# Patient Record
Sex: Female | Born: 1975 | Hispanic: Yes | Marital: Single | Smoking: Never smoker
Health system: Southern US, Community
[De-identification: ages and names within clinical notes are randomized; demographics above are authoritative.]

## PROBLEM LIST (undated history)

## (undated) ENCOUNTER — Inpatient Hospital Stay (HOSPITAL_COMMUNITY): Payer: Self-pay

## (undated) DIAGNOSIS — R519 Headache, unspecified: Secondary | ICD-10-CM

## (undated) DIAGNOSIS — R51 Headache: Secondary | ICD-10-CM

## (undated) DIAGNOSIS — Z789 Other specified health status: Secondary | ICD-10-CM

## (undated) HISTORY — PX: CHOLECYSTECTOMY: SHX55

## (undated) HISTORY — PX: APPENDECTOMY: SHX54

---

## 2005-06-23 ENCOUNTER — Inpatient Hospital Stay (HOSPITAL_COMMUNITY): Admission: AD | Admit: 2005-06-23 | Discharge: 2005-06-23 | Payer: Self-pay | Admitting: Family Medicine

## 2005-08-21 ENCOUNTER — Inpatient Hospital Stay (HOSPITAL_COMMUNITY): Admission: AD | Admit: 2005-08-21 | Discharge: 2005-08-21 | Payer: Self-pay | Admitting: Obstetrics and Gynecology

## 2005-09-17 ENCOUNTER — Inpatient Hospital Stay (HOSPITAL_COMMUNITY): Admission: AD | Admit: 2005-09-17 | Discharge: 2005-09-18 | Payer: Self-pay | Admitting: Obstetrics and Gynecology

## 2005-12-12 ENCOUNTER — Ambulatory Visit (HOSPITAL_COMMUNITY): Admission: RE | Admit: 2005-12-12 | Discharge: 2005-12-12 | Payer: Self-pay | Admitting: *Deleted

## 2006-04-03 ENCOUNTER — Ambulatory Visit: Payer: Self-pay | Admitting: Obstetrics and Gynecology

## 2006-04-06 ENCOUNTER — Ambulatory Visit: Payer: Self-pay | Admitting: Family Medicine

## 2006-04-06 ENCOUNTER — Inpatient Hospital Stay (HOSPITAL_COMMUNITY): Admission: AD | Admit: 2006-04-06 | Discharge: 2006-04-06 | Payer: Self-pay | Admitting: *Deleted

## 2006-04-06 ENCOUNTER — Inpatient Hospital Stay (HOSPITAL_COMMUNITY): Admission: AD | Admit: 2006-04-06 | Discharge: 2006-04-09 | Payer: Self-pay | Admitting: Gynecology

## 2006-05-14 ENCOUNTER — Inpatient Hospital Stay (HOSPITAL_COMMUNITY): Admission: EM | Admit: 2006-05-14 | Discharge: 2006-05-17 | Payer: Self-pay | Admitting: Emergency Medicine

## 2006-05-14 ENCOUNTER — Ambulatory Visit: Payer: Self-pay | Admitting: Family Medicine

## 2006-05-15 ENCOUNTER — Encounter (INDEPENDENT_AMBULATORY_CARE_PROVIDER_SITE_OTHER): Payer: Self-pay | Admitting: Specialist

## 2006-05-17 ENCOUNTER — Encounter: Admission: RE | Admit: 2006-05-17 | Discharge: 2006-06-16 | Payer: Self-pay | Admitting: Gynecology

## 2007-11-20 ENCOUNTER — Emergency Department (HOSPITAL_COMMUNITY): Admission: EM | Admit: 2007-11-20 | Discharge: 2007-11-20 | Payer: Self-pay | Admitting: Emergency Medicine

## 2008-07-23 ENCOUNTER — Inpatient Hospital Stay (HOSPITAL_COMMUNITY): Admission: AD | Admit: 2008-07-23 | Discharge: 2008-07-23 | Payer: Self-pay | Admitting: Family Medicine

## 2008-09-20 ENCOUNTER — Inpatient Hospital Stay (HOSPITAL_COMMUNITY): Admission: AD | Admit: 2008-09-20 | Discharge: 2008-09-20 | Payer: Self-pay | Admitting: Gynecology

## 2008-11-15 ENCOUNTER — Inpatient Hospital Stay (HOSPITAL_COMMUNITY): Admission: AD | Admit: 2008-11-15 | Discharge: 2008-11-15 | Payer: Self-pay | Admitting: Obstetrics & Gynecology

## 2008-11-27 ENCOUNTER — Ambulatory Visit (HOSPITAL_COMMUNITY): Admission: RE | Admit: 2008-11-27 | Discharge: 2008-11-27 | Payer: Self-pay | Admitting: Family Medicine

## 2009-02-06 ENCOUNTER — Ambulatory Visit: Payer: Self-pay | Admitting: Advanced Practice Midwife

## 2009-02-06 ENCOUNTER — Inpatient Hospital Stay (HOSPITAL_COMMUNITY): Admission: AD | Admit: 2009-02-06 | Discharge: 2009-02-08 | Payer: Self-pay | Admitting: Obstetrics and Gynecology

## 2010-05-30 ENCOUNTER — Emergency Department (HOSPITAL_COMMUNITY): Admission: EM | Admit: 2010-05-30 | Discharge: 2010-05-30 | Payer: Self-pay | Admitting: Emergency Medicine

## 2011-03-13 LAB — COMPREHENSIVE METABOLIC PANEL
AST: 26 U/L (ref 0–37)
Albumin: 4.2 g/dL (ref 3.5–5.2)
Alkaline Phosphatase: 76 U/L (ref 39–117)
Chloride: 103 mEq/L (ref 96–112)
GFR calc Af Amer: >60 mL/min (ref >60)
Potassium: 3.7 mEq/L (ref 3.5–5.1)
Total Bilirubin: 0.4 mg/dL (ref 0.3–1.2)
Total Protein: 7.7 g/dL (ref 6.0–8.3)

## 2011-03-13 LAB — POCT PREGNANCY, URINE: Preg Test, Ur: NEGATIVE

## 2011-03-13 LAB — URINE MICROSCOPIC-ADD ON

## 2011-03-13 LAB — CBC
Platelets: 226 10*3/uL (ref 150–400)
RDW: 12.8 % (ref 11.5–15.5)
WBC: 9 10*3/uL (ref 4.0–10.5)

## 2011-03-13 LAB — DIFFERENTIAL
Basophils Absolute: 0.1 10*3/uL (ref 0.0–0.1)
Eosinophils Relative: 7 % — ABNORMAL HIGH (ref 0–5)
Lymphocytes Relative: 29 % (ref 12–46)
Monocytes Absolute: 0.5 10*3/uL (ref 0.1–1.0)
Monocytes Relative: 6 % (ref 3–12)

## 2011-03-13 LAB — URINALYSIS, ROUTINE W REFLEX MICROSCOPIC
Bilirubin Urine: NEGATIVE
Glucose, UA: NEGATIVE mg/dL
Hgb urine dipstick: NEGATIVE
Specific Gravity, Urine: 1.021 (ref 1.005–1.030)
pH: 6.5 (ref 5.0–8.0)

## 2011-04-11 LAB — CBC
Hemoglobin: 11.7 g/dL — ABNORMAL LOW (ref 12.0–15.0)
MCHC: 33.8 g/dL (ref 30.0–36.0)
MCV: 84.1 fL (ref 78.0–100.0)
RBC: 4.11 MIL/uL (ref 3.87–5.11)
RDW: 16 % — ABNORMAL HIGH (ref 11.5–15.5)

## 2011-05-12 NOTE — H&P (Signed)
NAMEARENA, LINDAHL NO.:  [id number]   MEDICAL RECORD NO.:  [id number]          PATIENT TYPE:  EMS   LOCATION:  MAJO                         FACILITY:  [facility]   PHYSICIAN:  Velora Heckler, MD      DATE OF BIRTH:  01-04-1976   DATE OF ADMISSION:  05/14/2006  DATE OF DISCHARGE:                                HISTORY & PHYSICAL   CHIEF COMPLAINT:  Abdominal pain.   HISTORY OF PRESENT ILLNESS:  Ms. Dreese is a 35 year old female patient  recently postpartum about one month ago. She was awakened at 1 o'clock this  morning with complaints of severe epigastric and right upper quadrant  abdominal pain, continuous and intractable.  Patient came to the ER about 7  o'clock this morning.  Work-up revealed leukocytosis.  White count 13,600.  An ultrasound showed cholelithiasis with evidence of pericholecystic fluid  and gallbladder wall thickening consistent with acute cholecystitis.  No  ductal dilatation on ultrasound and LFTs have been normal.  History has been  obtained through the use of a translator.  Patient does not speak any  [place].  Her husband speaks limited [place].   ALLERGIES:  NO KNOWN DRUG ALLERGIES.   CURRENT MEDICATIONS:  None.   PAST MEDICAL HISTORY:  Recent childbirth.   PAST SURGICAL HISTORY:  Appendectomy as a child.   FAMILY HISTORY:  Noncontributory.   SOCIAL HISTORY:  Patient does not smoke or use alcohol products.  She is  currently on maternity leave.  She has a 7-month-old baby.  Husband  accompanies patient.   REVIEW OF SYSTEMS:  Patient is currently breast-feeding.  She has no similar  symptoms of epigastric pain, reflux or GI complaints in the past.   PHYSICAL EXAMINATION:  GENERAL APPEARANCE:  A female patient in acute  abdominal pain, very restless, has not received pain medications by the time  I evaluated her.  VITAL SIGNS:  Temperature 99.7, blood pressure 133/81, pulse 61 and regular,  respirations 20.  NECK:  Supple.   No adenopathy.  CHEST:  Bilateral lung sounds are clear to auscultation.  Respiratory effort  is nonlabored.  CARDIOVASCULAR:  S1 and S2, no murmurs, rubs, thrills or gallops.  ABDOMEN:  Soft.  Bowel sounds present.  She is exquisitely tender over the  epigastrium and right upper quadrant regions.  EXTREMITIES:  Symmetrical in appearance without clubbing, cyanosis, or  edema.  NEUROLOGIC:  Patient is alert and oriented x3, moving all extremities x4.   LABORATORY DATA:  Ultrasound shows gallstones with evidence of acute  cholecystitis. No ductal dilatation.  Lipase 24, amylase 52.  White count  13,200, hemoglobin 13, platelets 241,000.  Sodium 141, potassium 3.6, BUN  11, creatinine 0.6, glucose 131.  LFTs are normal.   IMPRESSION:  1.  Acute cholecystitis.  2.  Leukocytosis.  3.  Volume depletion.   PLAN:  1.  Admit patient to the general surgical floor, NPO, possibly OR this      afternoon pending availability of surgeon and OR suite.  More than      likely, for 10 o'clock in the morning.  2.  IV fluid hydration, D5 half normal saline with 20 potassium per liter at      150 an hour.  3.  Dilaudid for pain.  4.  Zofran for nausea.  5.  Begin Unasyn 3 g IV q.6h. for acute cholecystitis.  6.  Patient is breast-feeding.  Will attempt to obtain a breast pump and      supplies so patient can pump and discard her milk while hospitalized.      Allison L. Rennis Harding, N.P.      Velora Heckler, MD  Electronically Signed    ALE/MEDQ  D:  05/14/2006  T:  05/14/2006  Job:  [id number]

## 2011-05-12 NOTE — Discharge Summary (Signed)
Desiree Marquez, HEINKE NO.:  [id number]   MEDICAL RECORD NO.:  [id number]          PATIENT TYPE:  INP   LOCATION:  5708                         FACILITY:  [facility]   PHYSICIAN:  Velora Heckler, MD      DATE OF BIRTH:  22-Nov-1976   DATE OF ADMISSION:  05/14/2006  DATE OF DISCHARGE:  05/17/2006                                 DISCHARGE SUMMARY   DISCHARGING PHYSICIAN:  Velora Heckler, MD   CHIEF COMPLAINT/REASON FOR ADMISSION:  [name] is a 35 year old female  patient, 4 weeks postpartum, who awakened at 1:00 in the morning with severe  epigastric pain radiating into the right upper abdominal quadrant, which was  continuous and intractable.  By 7:00 that morning, she presented to the ER.  White count was 13,600.  An ultrasound revealed cholelithiasis with evidence  of pericholecystic fluid and gallbladder thickening consistent with acute  cholecystitis.  There was no ductal dilatation noted on ultrasound, and LFTs  were normal.  The patient does not speak English, and translator was used  during the history and physical exam, as well as explanation of surgical  procedure and consent application.   On exam, the patient was mildly febrile with a temperature of 99.7;  otherwise, vital signs were stable.  Her abdomen was soft with bowel sounds.  She was exquisitely tender over the epigastrium and right upper quadrant.  Because of the acuteness of the symptoms, she was taken to the OR on the  date of admission.   ADMITTING DIAGNOSES:  1.  Acute cholecystitis.  2.  Leukocytosis.  3.  Volume depletion.   HOSPITAL COURSE:  Because of OR scheduling and other emergent cases,  patient's cholecystectomy was deferred until May 15, 2006.  After patient  received pain medication and adequate IV fluid hydration, she was resting  more comfortably, and after Dr. Gerrit Friends discussed with the patient, they  agreed that they could wait until May 15, 2006, for surgical intervention.   On May 15, 2006, the patient was taken to the OR by Dr. Gerrit Friends, where she  underwent a laparoscopic cholecystectomy with intraoperative cholangiogram.  Her pre- and postoperative diagnoses were acute cholecystitis with  cholelithiasis.  The patient tolerated the procedure well without  complications and was sent to PACU to recovery and subsequently to the  General Medical Floor.  Postop day 1, patient was doing well.  Significant  pain.  Toradol was added to her underlying pain medication regimen to help  with inflammatory component of pain.  Important to note that on admission  patient is postpartum and apparently still breast-feeding her child.  Breast  pump was obtained for the patient so she could pump and discard her milk  while she was on pain medications.  She had received anesthesia, etc.   By postop day 2, patient was tolerating a diet and was deemed appropriate  for discharge home.  Her abdomen was soft.  Wound was unremarkable.  Greater  than 30 minutes was spent in preparation for discharge planning.  I  initially saw the patient at 11 a.m. and did  not complete discharge  paperwork and preparations until noon.  The discharge process was  complicated by determining appropriate medication to use in a breast-feeding  client.  I had to spend time with the pharmacist to determine appropriate  pain and antibiotic therapy to continue.  If patient was unable to  breastfeed using the medications necessary, we were also going to have to  obtain a breast pump to send home with the patient and make sure she  understood all the implications of not breast-feeding her child until the  medications have cleared from her system.  Fortunately, after discussing  with the pharmacist, it is okay to breastfeed using Percocet and Keflex, so  these medications were ordered.  Because she would be taking a narcotic pain  medication, we instructed her to be careful taking this medication while  caring for  her baby and recommended she have other people available to help  care for the baby, not only because of pain medications, but of activity  restrictions postoperatively.  She was also recommended to follow up with  the pediatrician regarding any concerns about medications and breast-  feeding.   FINAL DISCHARGE DIAGNOSES:  1.  Abdominal pain secondary to acute cholecystitis and cholelithiasis.  2.  Status post laparoscopic cholecystectomy with intraoperative      cholangiogram.  3.  Lactating female, currently breast-feeding infant.   DISCHARGE MEDICATIONS:  1.  Keflex 500 mg t.i.d. for the next 3 days.  2.  Percocet 5/325 one to two every 4-6 hours as needed for pain.   ACTIVITY:  Increase activity slowly.  May shower.  No driving while taking  the Percocet.  No lifting for 2 weeks.   WOUND CARE:  Allow Steri-Strips to fall off.   OTHER INSTRUCTIONS:  She has been instructed to pick up her breast pump at  [facility] at the Breast-feeding Department.  The charge is $30.  She  has also been instructed to watch to see if the baby gets too sleepy while  she is taking the Percocet.  If so, she may need to give bottled formula and  pump and throw away her breast milk.  She has also been instructed to bring  a translator with her to her appointment with Dr. Gerrit Friends.  She has been  instructed to call Dr. Gerrit Friends with any problems at [phone].  She has an  appointment scheduled for June 05, 2006, at 3 p.m.   Because of the complexity of her discharge instructions and the fact she  does not speak [place], these instructions were reviewed with the patient  through a translator.      Allison L. Rennis Harding, N.P.      Velora Heckler, MD  Electronically Signed    ALE/MEDQ  D:  06/15/2006  T:  06/15/2006  Job:  [id number]

## 2011-05-12 NOTE — Op Note (Signed)
Desiree Marquez, Desiree Marquez NO.:  [id number]   MEDICAL RECORD NO.:  [id number]          PATIENT TYPE:  INP   LOCATION:  5708                         FACILITY:  [facility]   PHYSICIAN:  Velora Heckler, MD      DATE OF BIRTH:  04/06/1976   DATE OF PROCEDURE:  05/15/2006  DATE OF DISCHARGE:                                 OPERATIVE REPORT   PREOPERATIVE DIAGNOSIS:  Acute cholecystitis, cholelithiasis.   POSTOPERATIVE DIAGNOSIS:  Acute cholecystitis, cholelithiasis.   PROCEDURE:  Laparoscopic cholecystectomy with intraoperative  cholangiography.   SURGEON:  Darnell Level, M.D., FACS   ASSISTANT:  Violeta Gelinas, M.D.   ANESTHESIA:  General.   ESTIMATED BLOOD LOSS:  Minimal.   PREPARATION:  Betadine.   COMPLICATIONS:  None.   INDICATIONS:  The patient is a 35 year old Hispanic female who presents to  the emergency department on May 21 with abdominal pain.  Workup revealed  gallbladder with gallstones and wall thickening consistent with  cholecystitis.  White blood cell count was elevated at 13,000.  Physical  examination showed right upper quadrant tenderness.  The patient was  admitted and treated with intravenous antibiotics.  She received medication  for pain control.  She is prepared and brought to the operating room for  cholecystectomy.   PROCEDURE DETAILS:  The procedure was done in OR #17 at the [facility].  The patient was brought to the operating room and placed  in supine position on the operating room table.  Following administration of  general anesthesia, the patient was prepped and draped in usual strict  aseptic fashion.  After ascertaining that an adequate level of anesthesia  been obtained, an infraumbilical incision was made with a #15 blade.  Dissection was carried down to the fascia.  Fascia was incised in the  midline.  The peritoneal cavity was entered cautiously.  Zero Vicryl  pursestring sutures were placed in the fascia.   A Hasson cannula was  introduced under direct vision and secured with a pursestring suture.  Abdomen was insufflated with carbon dioxide.  Laparoscope was introduced and  the abdomen explored.  There is a tense, distended, inflamed gallbladder in  the right upper quadrant with omental adhesions.  Operative ports were  placed along the right costal margin in the midline, midclavicular line and  anterior axillary line.  Fundus of the gallbladder was grasped.  It was  quite tense and distended and thick-walled.  Therefore, an aspirating trocar  was used to evacuate the gallbladder.  Gallbladder was then grasped and  retracted cephalad.  Omental adhesions were taken down with blunt  dissection.  Hemostasis obtained with electrocautery.  Dissection was  carried down to the neck of the gallbladder where the peritoneum was  incised.  Using gentle blunt dissection and judicious use of the  electrocautery,  the cystic duct was exposed.  It was dissected out along  its length.  A clip was placed at the neck of the gallbladder.  Cystic duct  was incised.  Clear yellow bile emanated from the cystic duct.  A Adriana Simas  cholangiography catheter was introduced through a stab wound in the right  upper quadrant and inserted into the cystic duct.  It was secured with a  Ligaclip.  Using C-arm fluoroscopy, real time cholangiography was performed.  There was a normal caliber common bile duct.  There was free flow distally  into the duodenum.  There was reflux of contrast proximally into the left  and right hepatic ductal systems.  Clip was withdrawn, and Cook catheter was  removed from the peritoneal cavity.  The gallbladder was then mobilized  further with gentle dissection.  Cystic artery was identified.  This was  doubly clipped and divided.  Posterior branch of the cystic artery was  doubly clipped and divided.  Remainder of the gallbladder was excised the  gallbladder bed using the hook electrocautery for  hemostasis.  Gallbladder  was completely excised and placed into an EndoCatch bag.  It was withdrawn  through the umbilical port.  Zero Vicryl pursestring was tied securely.  Right upper quadrant was copiously irrigated with warm saline which was  evacuated.  Good hemostasis was noted.  Pneumoperitoneum was released.  Ports were removed under direct vision.  All port sites show good  hemostasis.  Pneumoperitoneum was evacuated.  Port sites were anesthetized  with local anesthetic.  All wounds were closed with interrupted 4-0 Vicryl  subcuticular sutures.  Wounds were washed and dried, and Benzoin Steri-  Strips were applied.  Sterile dressings were applied.  The patient was  awakened from anesthesia and brought to the recovery room in stable  condition.  The patient tolerated the procedure well.      Velora Heckler, MD  Electronically Signed     TMG/MEDQ  D:  05/15/2006  T:  05/15/2006  Job:  [id number]

## 2011-09-22 LAB — CBC
Hemoglobin: 11.9 — ABNORMAL LOW
MCHC: 34.2
Platelets: 224
RDW: 12.8

## 2011-09-22 LAB — GC/CHLAMYDIA PROBE AMP, GENITAL: Chlamydia, DNA Probe: NEGATIVE

## 2011-09-22 LAB — ABO/RH: ABO/RH(D): O POS

## 2011-09-22 LAB — WET PREP, GENITAL
Clue Cells Wet Prep HPF POC: NONE SEEN
Trich, Wet Prep: NONE SEEN
Yeast Wet Prep HPF POC: NONE SEEN

## 2011-09-22 LAB — URINALYSIS, ROUTINE W REFLEX MICROSCOPIC
Bilirubin Urine: NEGATIVE
Glucose, UA: NEGATIVE
Hgb urine dipstick: NEGATIVE
Ketones, ur: 15 — AB
Protein, ur: NEGATIVE
pH: 6

## 2011-09-26 LAB — WET PREP, GENITAL
Clue Cells Wet Prep HPF POC: NONE SEEN
Trich, Wet Prep: NONE SEEN

## 2011-09-26 LAB — URINE MICROSCOPIC-ADD ON

## 2011-09-26 LAB — URINALYSIS, ROUTINE W REFLEX MICROSCOPIC
Bilirubin Urine: NEGATIVE
Glucose, UA: NEGATIVE
Hgb urine dipstick: NEGATIVE
Specific Gravity, Urine: 1.025
pH: 6

## 2011-10-03 LAB — URINALYSIS, ROUTINE W REFLEX MICROSCOPIC
Glucose, UA: NEGATIVE
Ketones, ur: NEGATIVE
Protein, ur: NEGATIVE

## 2011-10-03 LAB — DIFFERENTIAL
Basophils Absolute: 0.1
Basophils Relative: 1
Eosinophils Absolute: 0.5
Eosinophils Relative: 5
Lymphocytes Relative: 30

## 2011-10-03 LAB — COMPREHENSIVE METABOLIC PANEL
AST: 18
Alkaline Phosphatase: 67
CO2: 24
Chloride: 105
Creatinine, Ser: 0.56
GFR calc Af Amer: >60
GFR calc non Af Amer: >60
Potassium: 4
Total Bilirubin: 0.7

## 2011-10-03 LAB — CBC
HCT: 38.5
Hemoglobin: 13.1
MCHC: 33.9
MCV: 82.8
Platelets: 266
RBC: 4.65
RDW: 12.8
WBC: 8.5

## 2011-10-03 LAB — URINE MICROSCOPIC-ADD ON

## 2011-10-03 LAB — LIPASE, BLOOD: Lipase: 24

## 2011-12-31 ENCOUNTER — Encounter: Payer: Self-pay | Admitting: Emergency Medicine

## 2011-12-31 ENCOUNTER — Emergency Department (HOSPITAL_COMMUNITY)
Admission: EM | Admit: 2011-12-31 | Discharge: 2011-12-31 | Disposition: A | Discharge status: Home / Self Care | Payer: Self-pay | Attending: Emergency Medicine | Admitting: Emergency Medicine

## 2011-12-31 DIAGNOSIS — N949 Unspecified condition associated with female genital organs and menstrual cycle: Secondary | ICD-10-CM | POA: Insufficient documentation

## 2011-12-31 DIAGNOSIS — N390 Urinary tract infection, site not specified: Secondary | ICD-10-CM | POA: Insufficient documentation

## 2011-12-31 DIAGNOSIS — N72 Inflammatory disease of cervix uteri: Secondary | ICD-10-CM | POA: Insufficient documentation

## 2011-12-31 DIAGNOSIS — N898 Other specified noninflammatory disorders of vagina: Secondary | ICD-10-CM | POA: Insufficient documentation

## 2011-12-31 DIAGNOSIS — R109 Unspecified abdominal pain: Secondary | ICD-10-CM | POA: Insufficient documentation

## 2011-12-31 LAB — URINALYSIS, ROUTINE W REFLEX MICROSCOPIC
Bilirubin Urine: NEGATIVE
Ketones, ur: NEGATIVE mg/dL
Nitrite: POSITIVE — AB
Protein, ur: 100 mg/dL — AB
Urobilinogen, UA: 1 mg/dL (ref 0.0–1.0)

## 2011-12-31 LAB — POCT I-STAT, CHEM 8
Calcium, Ion: 1.11 mmol/L — ABNORMAL LOW (ref 1.12–1.32)
Glucose, Bld: 104 mg/dL — ABNORMAL HIGH (ref 70–99)
HCT: 40 % (ref 36.0–46.0)
TCO2: 24 mmol/L (ref 0–100)

## 2011-12-31 LAB — URINE MICROSCOPIC-ADD ON

## 2011-12-31 LAB — POCT PREGNANCY, URINE: Preg Test, Ur: NEGATIVE

## 2011-12-31 MED ORDER — PHENAZOPYRIDINE HCL 200 MG PO TABS: 200.0000 mg | ORAL_TABLET | Freq: Three times a day (TID) | ORAL | Status: AC

## 2011-12-31 MED ORDER — CIPROFLOXACIN HCL 500 MG PO TABS: 500.0000 mg | ORAL_TABLET | Freq: Two times a day (BID) | ORAL | Status: AC

## 2011-12-31 MED ORDER — KETOROLAC TROMETHAMINE 30 MG/ML IJ SOLN
60.0000 mg | Freq: Once | INTRAMUSCULAR | Status: AC
  Administered 2011-12-31: 60 mg via INTRAMUSCULAR
  Filled 2011-12-31: qty 2

## 2011-12-31 MED ORDER — METRONIDAZOLE 500 MG PO TABS: 500.0000 mg | ORAL_TABLET | Freq: Two times a day (BID) | ORAL | Status: AC

## 2011-12-31 MED ORDER — CIPROFLOXACIN HCL 500 MG PO TABS: 500.0000 mg | ORAL_TABLET | Freq: Two times a day (BID) | ORAL | Status: DC

## 2011-12-31 NOTE — ED Notes (Signed)
PT. REPORTS LOW ABDOMINAL / VAGINAL PAIN FOR 3 DAYS WITH FEVER AND CHILLS , DENIES NAUSEA ,VOMITTING OR DIARRHEA .

## 2011-12-31 NOTE — ED Provider Notes (Signed)
History     CSN: [id number]  Arrival date & time 12/31/11  0601   First MD Initiated Contact with Patient 12/31/11 [phone]      Chief Complaint  Patient presents with  . Abdominal Pain    (Consider location/radiation/quality/duration/timing/severity/associated sxs/prior treatment) HPI  Pt presents to the emergency department with complaints of low abdominal pain and vaginal pain for 3 days. She has been having 1 day of bleeding and two days of discharge. She denies N/V/D. Pt has been unable to have sex because of the pain. The pain is sharp and severe. Pt has a merina device in, which she has had for 3 years and has never caused problems.  History reviewed. No pertinent past medical history.  Past Surgical History  Procedure Date  . Appendectomy   . Cholecystectomy     No family history on file.  History  Substance Use Topics  . Smoking status: Never Smoker   . Smokeless tobacco: Not on file  . Alcohol Use: No    OB History    Grav Para Term Preterm Abortions TAB SAB Ect Mult Living                  Review of Systems  All other systems reviewed and are negative.    Allergies  Review of patient's allergies indicates no known allergies.  Home Medications  No current outpatient prescriptions on file.  BP 120/82  Pulse 85  Temp(Src) 98.5 F (36.9 C) (Oral)  Resp 18  SpO2 94%  LMP 11/30/2011  Physical Exam  Nursing note and vitals reviewed. Constitutional: She is oriented to person, place, and time. She appears well-developed and well-nourished.  HENT:  Head: Normocephalic and atraumatic.  Eyes: Conjunctivae are normal. Pupils are equal, round, and reactive to light.  Neck: Trachea normal, normal range of motion and full passive range of motion without pain. Neck supple.  Cardiovascular: Normal rate, regular rhythm and normal pulses.   Pulmonary/Chest: Effort normal and breath sounds normal. Chest wall is not dull to percussion. She exhibits no tenderness,  no crepitus, no edema, no deformity and no retraction.  Abdominal: Soft. Normal appearance and bowel sounds are normal. She exhibits no shifting dullness, no distension, no pulsatile liver, no fluid wave, no ascites and no mass. There is hepatosplenomegaly. There is tenderness in the suprapubic area. There is guarding. There is no rigidity, no rebound, no CVA tenderness, no tenderness at McBurney's point and negative Murphy's sign.  Genitourinary: Vagina normal and uterus normal. Pelvic exam was performed with patient supine. Cervix exhibits motion tenderness and discharge. Cervix exhibits no friability. Right adnexum displays tenderness. Right adnexum displays no mass and no fullness. Left adnexum displays no mass, no tenderness and no fullness.  Musculoskeletal: Normal range of motion.  Lymphadenopathy:       Head (right side): No submental, no submandibular, no tonsillar, no preauricular, no posterior auricular and no occipital adenopathy present.       Head (left side): No submental, no submandibular, no tonsillar, no preauricular, no posterior auricular and no occipital adenopathy present.    She has no cervical adenopathy.    She has no axillary adenopathy.  Neurological: She is oriented to person, place, and time. She has normal strength.  Skin: Skin is warm, dry and intact.  Psychiatric: Her speech is normal. Cognition and memory are normal.    ED Course  Procedures (including critical care time)  Labs Reviewed  URINALYSIS, ROUTINE W REFLEX MICROSCOPIC - Abnormal; Notable  for the following:    Color, Urine AMBER (*) BIOCHEMICALS MAY BE AFFECTED BY COLOR   APPearance TURBID (*)    Hgb urine dipstick LARGE (*)    Protein, ur 100 (*)    Nitrite POSITIVE (*)    Leukocytes, UA LARGE (*)    All other components within normal limits  WET PREP, GENITAL - Abnormal; Notable for the following:    Clue Cells, Wet Prep RARE (*)    WBC, Wet Prep HPF POC MODERATE (*)    All other components  within normal limits  URINE MICROSCOPIC-ADD ON - Abnormal; Notable for the following:    Bacteria, UA MANY (*)    All other components within normal limits  POCT PREGNANCY, URINE  POCT PREGNANCY, URINE  GC/CHLAMYDIA PROBE AMP, GENITAL   No results found.   1. UTI (lower urinary tract infection)   2. Cervicitis       MDM    Pt has a UTI and cervicitis, will treat with antibiotics and pain medication and have patient do close follow-up with an OB to ensure resolution of infections. If patient can not see OB, will have her return to the ED for check-up.      Dorthula Matas, PA 12/31/11 [phone]

## 2011-12-31 NOTE — ED Provider Notes (Signed)
Medical screening examination/treatment/procedure(s) were performed by non-physician practitioner and as supervising physician I was immediately available for consultation/collaboration. Devoria Albe, MD, Armando Gang   Ward Givens, MD 12/31/11 [phone]

## 2011-12-31 NOTE — ED Notes (Signed)
Pt reports stomach and vaginal pain for 3 days. No bleeding. Pelvic cart set up for further assessment by MD.

## 2012-01-01 LAB — GC/CHLAMYDIA PROBE AMP, GENITAL
Chlamydia, DNA Probe: NEGATIVE
GC Probe Amp, Genital: NEGATIVE

## 2013-03-27 ENCOUNTER — Encounter (HOSPITAL_COMMUNITY): Payer: Self-pay | Admitting: *Deleted

## 2013-03-27 DIAGNOSIS — R109 Unspecified abdominal pain: Secondary | ICD-10-CM | POA: Insufficient documentation

## 2013-03-27 DIAGNOSIS — N760 Acute vaginitis: Secondary | ICD-10-CM | POA: Insufficient documentation

## 2013-03-27 DIAGNOSIS — D259 Leiomyoma of uterus, unspecified: Secondary | ICD-10-CM | POA: Insufficient documentation

## 2013-03-27 DIAGNOSIS — Z3202 Encounter for pregnancy test, result negative: Secondary | ICD-10-CM | POA: Insufficient documentation

## 2013-03-27 DIAGNOSIS — Z9089 Acquired absence of other organs: Secondary | ICD-10-CM | POA: Insufficient documentation

## 2013-03-27 NOTE — ED Notes (Signed)
The pt is c/o lt flank and lower abd pain since this am.  No nor v.  lmp march 28th

## 2013-03-28 ENCOUNTER — Encounter (HOSPITAL_COMMUNITY): Payer: Self-pay | Admitting: Radiology

## 2013-03-28 ENCOUNTER — Emergency Department (HOSPITAL_COMMUNITY): Payer: Self-pay

## 2013-03-28 ENCOUNTER — Emergency Department (HOSPITAL_COMMUNITY)
Admission: EM | Admit: 2013-03-28 | Discharge: 2013-03-28 | Disposition: A | Discharge status: Home / Self Care | Payer: Self-pay | Attending: Emergency Medicine | Admitting: Emergency Medicine

## 2013-03-28 DIAGNOSIS — R109 Unspecified abdominal pain: Secondary | ICD-10-CM

## 2013-03-28 DIAGNOSIS — D219 Benign neoplasm of connective and other soft tissue, unspecified: Secondary | ICD-10-CM

## 2013-03-28 DIAGNOSIS — B9689 Other specified bacterial agents as the cause of diseases classified elsewhere: Secondary | ICD-10-CM

## 2013-03-28 LAB — CBC WITH DIFFERENTIAL/PLATELET
Lymphocytes Relative: 36 % (ref 12–46)
Lymphs Abs: 3.2 10*3/uL (ref 0.7–4.0)
Monocytes Relative: 5 % (ref 3–12)
Neutro Abs: 4.7 10*3/uL (ref 1.7–7.7)
Neutrophils Relative %: 53 % (ref 43–77)
WBC: 8.9 10*3/uL (ref 4.0–10.5)

## 2013-03-28 LAB — URINALYSIS, ROUTINE W REFLEX MICROSCOPIC
Bilirubin Urine: NEGATIVE
Hgb urine dipstick: NEGATIVE
Nitrite: NEGATIVE
Protein, ur: NEGATIVE mg/dL
Specific Gravity, Urine: 1.016 (ref 1.005–1.030)
Urobilinogen, UA: 0.2 mg/dL (ref 0.0–1.0)

## 2013-03-28 LAB — COMPREHENSIVE METABOLIC PANEL
Alkaline Phosphatase: 69 U/L (ref 39–117)
BUN: 12 mg/dL (ref 6–23)
Chloride: 100 mEq/L (ref 96–112)
GFR calc Af Amer: >90 mL/min (ref >90)
Glucose, Bld: 93 mg/dL (ref 70–99)
Potassium: 4.1 mEq/L (ref 3.5–5.1)
Total Bilirubin: 0.3 mg/dL (ref 0.3–1.2)

## 2013-03-28 LAB — WET PREP, GENITAL

## 2013-03-28 LAB — GC/CHLAMYDIA PROBE AMP: CT Probe RNA: NEGATIVE

## 2013-03-28 MED ORDER — HYDROMORPHONE HCL PF 1 MG/ML IJ SOLN
1.0000 mg | Freq: Once | INTRAMUSCULAR | Status: AC
  Administered 2013-03-28: 1 mg via INTRAVENOUS
  Filled 2013-03-28: qty 1

## 2013-03-28 MED ORDER — OXYCODONE-ACETAMINOPHEN 5-325 MG PO TABS: 1.0000 | ORAL_TABLET | ORAL | Status: DC | PRN

## 2013-03-28 MED ORDER — HYDROMORPHONE HCL PF 1 MG/ML IJ SOLN: 1.0000 mg | Freq: Once | INTRAMUSCULAR | Status: DC

## 2013-03-28 MED ORDER — METRONIDAZOLE 500 MG PO TABS: 500.0000 mg | ORAL_TABLET | Freq: Two times a day (BID) | ORAL | Status: DC

## 2013-03-28 MED ORDER — ONDANSETRON HCL 4 MG/2ML IJ SOLN
4.0000 mg | Freq: Once | INTRAMUSCULAR | Status: AC
  Administered 2013-03-28: 4 mg via INTRAVENOUS
  Filled 2013-03-28: qty 2

## 2013-03-28 NOTE — ED Provider Notes (Signed)
History     CSN: [id number]  Arrival date & time 03/27/13  2339   First MD Initiated Contact with Patient 03/28/13 0145      Chief Complaint  Patient presents with  . Abdominal Pain     Patient is a 37 y.o. female presenting with abdominal pain. The history is provided by the patient.  Abdominal Pain Pain location:  L flank Pain quality: sharp   Pain radiates to:  LLQ Pain severity:  Moderate Onset quality:  Gradual Duration:  1 day Timing:  Constant Progression:  Worsening Chronicity:  New Relieved by:  Nothing Worsened by:  Palpation Associated symptoms: no chest pain, no diarrhea, no fever, no shortness of breath and no vomiting     PMH - none  Past Surgical History  Procedure Laterality Date  . Appendectomy    . Cholecystectomy      No family history on file.  History  Substance Use Topics  . Smoking status: Never Smoker   . Smokeless tobacco: Not on file  . Alcohol Use: No    OB History   Grav Para Term Preterm Abortions TAB SAB Ect Mult Living                  Review of Systems  Constitutional: Negative for fever.  Respiratory: Negative for shortness of breath.   Cardiovascular: Negative for chest pain.  Gastrointestinal: Positive for abdominal pain. Negative for vomiting and diarrhea.  All other systems reviewed and are negative.    Allergies  Review of patient's allergies indicates no known allergies.  Home Medications  No current outpatient prescriptions on file.  BP 110/65  Pulse 73  Temp(Src) 98.1 F (36.7 C) (Oral)  Resp 16  SpO2 99%  LMP 03/21/2013  Physical Exam CONSTITUTIONAL: Well developed/well nourished, she appears uncomfortable HEAD: Normocephalic/atraumatic EYES: EOMI/PERRL ENMT: Mucous membranes moist NECK: supple no meningeal signs SPINE:entire spine nontender CV: S1/S2 noted, no murmurs/rubs/gallops noted LUNGS: Lungs are clear to auscultation bilaterally, no apparent distress ABDOMEN: soft, nontender, no  rebound or guarding WU:JWJX cva tenderness NEURO: Pt is awake/alert, moves all extremitiesx4 EXTREMITIES: pulses normal, full ROM SKIN: warm, color normal PSYCH: mildly anxious  ED Course  Procedures (including critical care time)  Labs Reviewed  CBC WITH DIFFERENTIAL - Abnormal; Notable for the following:    Eosinophils Relative 6 (*)    All other components within normal limits  COMPREHENSIVE METABOLIC PANEL - Abnormal; Notable for the following:    Sodium 134 (*)    All other components within normal limits  URINALYSIS, ROUTINE W REFLEX MICROSCOPIC  PREGNANCY, URINE   Ct Abdomen Pelvis Wo Contrast  03/28/2013  *RADIOLOGY REPORT*  Clinical Data: Left flank pain and left lower quadrant pain.  CT ABDOMEN AND PELVIS WITHOUT CONTRAST  Technique:  Multidetector CT imaging of the abdomen and pelvis was performed following the standard protocol without intravenous contrast.  Comparison: None.  Findings: The lung bases are clear.  The kidneys appear symmetrical in size and shape.  No pyelocaliectasis or ureterectasis.  No renal, ureteral, or bladder stones.  No bladder wall thickening.  Surgical absence of the gallbladder.  The unenhanced appearance of the liver, spleen, pancreas, adrenal glands, abdominal aorta, inferior vena cava, and retroperitoneal lymph nodes is unremarkable.  The stomach and small bowel are decompressed.  Stool filled colon without wall thickening.  The no free air or free fluid in the abdomen.  Pelvis:  Uterus and adnexal structures are not enlarged.  Surgical clips likely  representing tubal ligation.  No evidence of diverticulitis.  The appendix is not identified.  No free or loculated pelvic fluid collections.  No significant pelvic lymphadenopathy.  Normal alignment of the lumbar vertebrae.  IMPRESSION: No renal or ureteral stone or obstruction.   Original Report Authenticated By: Burman Nieves, M.D.    4:05 AM Ct imaging/labs unremarkable.  Pt still in significant  pain I utilized interpreter, and she reports onset of pain in left flank and "in the ovary" She denies vag bleeding/discharge She has not this previously Will need to perform pelvic exam with nurse and likely need ultrasound to r/o ovarian torsion Pt agreeable 4:16 AM Pelvic exam reveals left adnexal tenderness but no mass.  No cmt.  No vag bleeding or significant discharge.  Chaperone present US imaging pending at this time   5:32 AM No signs of torsion on [place] Pt stable for d/c Referred to gynecology  MDM  Nursing notes including past medical history and social history reviewed and considered in documentation Labs/vital reviewed and considered         Joya Gaskins, MD 03/28/13 [phone]

## 2013-05-31 ENCOUNTER — Inpatient Hospital Stay (HOSPITAL_COMMUNITY)
Admission: AD | Admit: 2013-05-31 | Discharge: 2013-05-31 | Disposition: A | Discharge status: Home / Self Care | Payer: Self-pay | Source: Ambulatory Visit | Attending: Family Medicine | Admitting: Family Medicine

## 2013-05-31 ENCOUNTER — Encounter (HOSPITAL_COMMUNITY): Payer: Self-pay | Admitting: Family

## 2013-05-31 ENCOUNTER — Inpatient Hospital Stay (HOSPITAL_COMMUNITY): Payer: Self-pay

## 2013-05-31 DIAGNOSIS — O039 Complete or unspecified spontaneous abortion without complication: Secondary | ICD-10-CM

## 2013-05-31 HISTORY — DX: Other specified health status: Z78.9

## 2013-05-31 LAB — CBC
HCT: 35.5 % — ABNORMAL LOW (ref 36.0–46.0)
MCH: 28 pg (ref 26.0–34.0)
MCV: 80.9 fL (ref 78.0–100.0)
Platelets: 161 10*3/uL (ref 150–400)
RBC: 4.39 MIL/uL (ref 3.87–5.11)

## 2013-05-31 LAB — WET PREP, GENITAL
Clue Cells Wet Prep HPF POC: NONE SEEN
Yeast Wet Prep HPF POC: NONE SEEN

## 2013-05-31 MED ORDER — IBUPROFEN 800 MG PO TABS: 800.0000 mg | ORAL_TABLET | Freq: Three times a day (TID) | ORAL | Status: DC | PRN

## 2013-05-31 MED ORDER — OXYCODONE-ACETAMINOPHEN 5-325 MG PO TABS: 1.0000 | ORAL_TABLET | ORAL | Status: DC | PRN

## 2013-05-31 NOTE — MAU Note (Signed)
Patient presents to MAU with c/o vaginal bleeding since yesterday, progressively worse today. Reports abdominal cramping x 2 days.  Reports +HPT at health dept. LMP 03/18/13.

## 2013-05-31 NOTE — MAU Provider Note (Signed)
History     CSN: [id number]  Arrival date and time: 05/31/13 4098   None     Chief Complaint  Patient presents with  . Vaginal Bleeding   HPI 37 y.o. G3P2 at [redacted]w[redacted]d by LMP here with bleeding x 2 days, getting heavier, + cramping.   Past Medical History  Diagnosis Date  . Medical history non-contributory     Past Surgical History  Procedure Laterality Date  . Appendectomy    . Cholecystectomy    . Cholecystectomy    . Appendectomy      History reviewed. No pertinent family history.  History  Substance Use Topics  . Smoking status: Not on file  . Smokeless tobacco: Never Used  . Alcohol Use: No    Allergies: No Known Allergies  Prescriptions prior to admission  Medication Sig Dispense Refill  . metroNIDAZOLE (FLAGYL) 500 MG tablet Take 1 tablet (500 mg total) by mouth 2 (two) times daily. One po bid x 7 days  14 tablet  0  . oxyCODONE-acetaminophen (PERCOCET/ROXICET) 5-325 MG per tablet Take 1 tablet by mouth every 4 (four) hours as needed for pain.  15 tablet  0    Review of Systems  Constitutional: Negative.   Respiratory: Negative.   Cardiovascular: Negative.   Gastrointestinal: Positive for abdominal pain. Negative for nausea, vomiting, diarrhea and constipation.  Genitourinary: Negative for dysuria, urgency, frequency, hematuria and flank pain.       + bleeding   Musculoskeletal: Negative.   Neurological: Negative.   Psychiatric/Behavioral: Negative.    Physical Exam   Blood pressure 126/77, pulse 72, temperature 97.8 F (36.6 C), temperature source Oral, resp. rate 16, last menstrual period 03/21/2013.  Physical Exam  Constitutional: She is oriented to person, place, and time. She appears well-developed and well-nourished. No distress.  HENT:  Head: Normocephalic and atraumatic.  Cardiovascular: Normal rate and regular rhythm.   Respiratory: Effort normal. No respiratory distress.  GI: Soft. She exhibits no distension and no mass. There is no  tenderness. There is no rebound and no guarding.  Genitourinary: There is no rash or lesion on the right labia. There is no rash or lesion on the left labia. Uterus is not deviated, not enlarged, not fixed and not tender. Cervix exhibits no motion tenderness, no discharge and no friability. Right adnexum displays no mass, no tenderness and no fullness. Left adnexum displays no mass, no tenderness and no fullness. There is bleeding (small) around the vagina. No erythema or tenderness around the vagina. No vaginal discharge found.  Neurological: She is alert and oriented to person, place, and time.  Skin: Skin is warm and dry.  Psychiatric: She has a normal mood and affect.   Unable to doppler FHT MAU Course  Procedures Results for orders placed during the hospital encounter of 05/31/13 (from the past 72 hour(s))  POCT PREGNANCY, URINE     Status: Abnormal   Collection Time    05/31/13 10:02 AM      Result Value Range   Preg Test, Ur POSITIVE (*) NEGATIVE   Comment:            THE SENSITIVITY OF THIS     METHODOLOGY IS >24 mIU/mL  POCT PREGNANCY, URINE     Status: Abnormal   Collection Time    05/31/13 10:07 AM      Result Value Range   Preg Test, Ur POSITIVE (*) NEGATIVE   Comment:  THE SENSITIVITY OF THIS     METHODOLOGY IS >24 mIU/mL  WET PREP, GENITAL     Status: Abnormal   Collection Time    05/31/13 10:24 AM      Result Value Range   Yeast Wet Prep HPF POC NONE SEEN  NONE SEEN   Trich, Wet Prep NONE SEEN  NONE SEEN   Clue Cells Wet Prep HPF POC NONE SEEN  NONE SEEN   WBC, Wet Prep HPF POC FEW (*) NONE SEEN   Comment: MODERATE BACTERIA SEEN  CBC     Status: Abnormal   Collection Time    05/31/13 10:37 AM      Result Value Range   WBC 5.2  4.0 - 10.5 K/uL   RBC 4.39  3.87 - 5.11 MIL/uL   Hemoglobin 12.3  12.0 - 15.0 g/dL   HCT 16.1 (*) 09.6 - 04.5 %   MCV 80.9  78.0 - 100.0 fL   MCH 28.0  26.0 - 34.0 pg   MCHC 34.6  30.0 - 36.0 g/dL   RDW 40.9  81.1 - 91.4 %    Platelets 161  150 - 400 K/uL  HCG, QUANTITATIVE, PREGNANCY     Status: Abnormal   Collection Time    05/31/13 10:37 AM      Result Value Range   hCG, Beta Chain, Quant, S 7124 (*) <5 mIU/mL   Comment:              GEST. AGE      CONC.  (mIU/mL)       <=1 WEEK        5 - 50         2 WEEKS       50 - 500         3 WEEKS       100 - 10,000         4 WEEKS     1,000 - 30,000         5 WEEKS     3,500 - 115,000       6-8 WEEKS     12,000 - 270,000        12 WEEKS     15,000 - 220,000                FEMALE AND NON-PREGNANT FEMALE:         LESS THAN 5 mIU/mL    US Ob Comp Less 14 Wks  05/31/2013   *RADIOLOGY REPORT*  Clinical Data: Pregnant, bleeding.  Unable to detect fetal heart tones on Doppler. Gravida 3, para 2.  OBSTETRIC <14 WK [place] AND TRANSVAGINAL OB US  Technique:  Both transabdominal and transvaginal ultrasound examinations were performed for complete evaluation of the gestation as well as the maternal uterus, adnexal regions, and pelvic cul-de-sac.  Transvaginal technique was performed to assess early pregnancy.  Comparison:  03/28/2013  Intrauterine gestational sac:  Present Yolk sac: Not seen Embryo: Present Cardiac Activity: Not seen Heart Rate: Zero bpm  CRL: 13.1  mm  7 w  4 d Maternal uterus/adnexae: The ovaries have a normal appearance.  Small anterior fibroid is 2.2 x 1.2 x 1.8 cm.  Posterior fibroid is 1.0 x 0.5 x 1.8 cm.  IMPRESSION:  1.  Early intrauterine fetal demise. 2.  Small uterine fibroids identified.   Original Report Authenticated By: Norva Pavlov, M.D.   US Ob Transvaginal  05/31/2013   *RADIOLOGY REPORT*  Clinical Data: Pregnant, bleeding.  Unable to detect fetal heart tones on Doppler. Gravida 3, para 2.  OBSTETRIC <14 WK [place] AND TRANSVAGINAL OB US  Technique:  Both transabdominal and transvaginal ultrasound examinations were performed for complete evaluation of the gestation as well as the maternal uterus, adnexal regions, and pelvic cul-de-sac.  Transvaginal  technique was performed to assess early pregnancy.  Comparison:  03/28/2013  Intrauterine gestational sac:  Present Yolk sac: Not seen Embryo: Present Cardiac Activity: Not seen Heart Rate: Zero bpm  CRL: 13.1  mm  7 w  4 d Maternal uterus/adnexae: The ovaries have a normal appearance.  Small anterior fibroid is 2.2 x 1.2 x 1.8 cm.  Posterior fibroid is 1.0 x 0.5 x 1.8 cm.  IMPRESSION:  1.  Early intrauterine fetal demise. 2.  Small uterine fibroids identified.   Original Report Authenticated By: Norva Pavlov, M.D.    Assessment and Plan   1. SAB (spontaneous abortion)   With interpreter, discussed results of u/s with patient. Pt initially did not understand diagnosis of miscarriage, reiterated u/s findings and explained further. Pt was very upset and reluctant to accept results. Pt stated she felt the baby moving inside her belly and that she felt it was alive. Pt asked if she "could leave it there for 9 months". Again, explained u/s results. Discussed cytotec vs. Expectant mgmt. Pt opts for expectant mgmt at this time. Rev'd precautions. Will f/u in clinic in 2 weeks. Discussed the possibility of needing medical or surgical management if miscarriage does not complete spontaneously.     Medication List    TAKE these medications       ibuprofen 800 MG tablet  Commonly known as:  ADVIL,MOTRIN  Take 1 tablet (800 mg total) by mouth every 8 (eight) hours as needed for pain.     oxyCODONE-acetaminophen 5-325 MG per tablet  Commonly known as:  PERCOCET/ROXICET  Take 1 tablet by mouth every 4 (four) hours as needed for pain.     prenatal multivitamin Tabs  Take 1 tablet by mouth daily at 12 noon.            Follow-up Information   Follow up with [facility] In 2 weeks. (someone will call to schedule)    Contact information:   [address] [place] [address] [phone]        [facility] 05/31/2013, 10:11 AM

## 2013-06-01 ENCOUNTER — Inpatient Hospital Stay (HOSPITAL_COMMUNITY)
Admission: AD | Admit: 2013-06-01 | Discharge: 2013-06-01 | Disposition: A | Discharge status: Home / Self Care | Payer: Self-pay | Source: Ambulatory Visit | Attending: Obstetrics & Gynecology | Admitting: Obstetrics & Gynecology

## 2013-06-01 ENCOUNTER — Encounter (HOSPITAL_COMMUNITY): Payer: Self-pay | Admitting: *Deleted

## 2013-06-01 DIAGNOSIS — IMO0002 Reserved for concepts with insufficient information to code with codable children: Secondary | ICD-10-CM

## 2013-06-01 DIAGNOSIS — O021 Missed abortion: Secondary | ICD-10-CM | POA: Insufficient documentation

## 2013-06-01 LAB — GC/CHLAMYDIA PROBE AMP
CT Probe RNA: NEGATIVE
GC Probe RNA: NEGATIVE

## 2013-06-01 MED ORDER — MISOPROSTOL 200 MCG PO TABS
800.0000 ug | ORAL_TABLET | Freq: Once | ORAL | Status: AC
  Administered 2013-06-01: 800 ug via VAGINAL
  Filled 2013-06-01: qty 4

## 2013-06-01 MED ORDER — PROMETHAZINE HCL 12.5 MG PO TABS: 12.5000 mg | ORAL_TABLET | Freq: Four times a day (QID) | ORAL | Status: DC | PRN

## 2013-06-01 NOTE — MAU Provider Note (Signed)
History     CSN: [id number]  Arrival date and time: 06/01/13 0028   None     Chief Complaint  Patient presents with  . Vaginal Bleeding   HPI  Pt is a G3P2002 at 7 wk intrauterine fetal demise according to ultrasound completed on 05/31/13.  Pt seen earlier in MAU for vaginal bleeding with the following results:  CBC Status: Abnormal    Collection Time    05/31/13 10:37 AM   Result  Value  Range    WBC  5.2  4.0 - 10.5 K/uL    RBC  4.39  3.87 - 5.11 MIL/uL    Hemoglobin  12.3  12.0 - 15.0 g/dL    HCT  40.9 (*)  81.1 - 46.0 %    MCV  80.9  78.0 - 100.0 fL    MCH  28.0  26.0 - 34.0 pg    MCHC  34.6  30.0 - 36.0 g/dL    RDW  91.4  78.2 - 95.6 %    Platelets  161  150 - 400 K/uL   HCG, QUANTITATIVE, PREGNANCY Status: Abnormal    Collection Time    05/31/13 10:37 AM   Result  Value  Range    hCG, Beta Chain, Quant, S  7124 (*)  <5 mIU/mL    Comment:      GEST. AGE CONC. (mIU/mL)     <=1 WEEK 5 - 50     2 WEEKS 50 - 500     3 WEEKS 100 - 10,000     4 WEEKS 1,000 - 30,000     5 WEEKS 3,500 - 115,000     6-8 WEEKS 12,000 - 270,000     12 WEEKS 15,000 - 220,000         FEMALE AND NON-PREGNANT FEMALE:     LESS THAN 5 mIU/mL   US Ob Comp Less 14 Wks  05/31/2013 *RADIOLOGY REPORT* Clinical Data: Pregnant, bleeding. Unable to detect fetal heart tones on Doppler. Gravida 3, para 2. OBSTETRIC <14 WK [place] AND TRANSVAGINAL OB US Technique: Both transabdominal and transvaginal ultrasound examinations were performed for complete evaluation of the gestation as well as the maternal uterus, adnexal regions, and pelvic cul-de-sac. Transvaginal technique was performed to assess early pregnancy. Comparison: 03/28/2013 Intrauterine gestational sac: Present Yolk sac: Not seen Embryo: Present Cardiac Activity: Not seen Heart Rate: Zero bpm CRL: 13.1 mm 7 w 4 d Maternal uterus/adnexae: The ovaries have a normal appearance. Small anterior fibroid is 2.2 x 1.2 x 1.8 cm. Posterior fibroid is 1.0 x 0.5 x 1.8  cm. IMPRESSION: 1. Early intrauterine fetal demise. 2. Small uterine fibroids identified. Original Report Authenticated By: Norva Pavlov, M.D.      Past Medical History  Diagnosis Date  . Medical history non-contributory     Past Surgical History  Procedure Laterality Date  . Appendectomy    . Cholecystectomy    . Cholecystectomy    . Appendectomy      History reviewed. No pertinent family history.  History  Substance Use Topics  . Smoking status: Never Smoker   . Smokeless tobacco: Never Used  . Alcohol Use: No    Allergies: No Known Allergies  Prescriptions prior to admission  Medication Sig Dispense Refill  . ibuprofen (ADVIL,MOTRIN) 800 MG tablet Take 1 tablet (800 mg total) by mouth every 8 (eight) hours as needed for pain.  30 tablet  0  . oxyCODONE-acetaminophen (PERCOCET/ROXICET) 5-325 MG per tablet Take 1 tablet  by mouth every 4 (four) hours as needed for pain.  20 tablet  0  . Prenatal Vit-Fe Fumarate-FA (PRENATAL MULTIVITAMIN) TABS Take 1 tablet by mouth daily at 12 noon.       Discharged with expected management, however pt is here desiring cytotec placed in MAU.  Husband present for this visit.    Review of Systems  Gastrointestinal: Positive for abdominal pain (cramping).  Genitourinary:       Vaginal bleeding  All other systems reviewed and are negative.   Physical Exam   Blood pressure 129/81, pulse 69, temperature 98.4 F (36.9 C), temperature source Oral, resp. rate 18, last menstrual period 03/21/2013, SpO2 100.00%.  Physical Exam  Constitutional: She is oriented to person, place, and time. She appears well-developed and well-nourished. No distress.  HENT:  Head: Normocephalic.  Neck: Normal range of motion. Neck supple.  Cardiovascular: Normal rate, regular rhythm and normal heart sounds.   Respiratory: Effort normal and breath sounds normal. No respiratory distress.  GI: Soft. She exhibits no mass. There is no tenderness. There is no  rebound and no guarding.  Genitourinary: There is bleeding around the vagina.  Musculoskeletal: Normal range of motion. She exhibits no edema.  Neurological: She is alert and oriented to person, place, and time.  Skin: Skin is warm and dry.   Cytotec 800 mcg placed vaginally without difficulty.  MAU Course  Procedures  Early Intrauterine Pregnancy Failure Protocol X  Documented intrauterine pregnancy failure less than or equal to [redacted] weeks   gestation  X  No serious current illness  X  Baseline Hgb greater than or equal to 10g/dl  X  Patient has easily accessible transportation to the hospital  X  Clear preference  X  Practitioner/physician deems patient reliable  X  Counseling by practitioner or physician  X  Patient education by RN  X   Intravaginally by NP in MAU  X   Ibuprofen 600 mg 1 tablet by mouth every 6 hours as needed #30 - prescribed  X   Tylenol #3 mg by mouth every 4 to 6 hours as needed - prescribed  X   Phenergan 12.5 mg by mouth every 4 hours as needed for nausea - prescribed  Reviewed with pt cytotec procedure.  Pt verbalizes that she lives close to the hospital and has transportation readily available.  Pt and husband appear reliable and verbalizes understanding and agrees with plan of care   Assessment and Plan  7 wk fetal demise  Plan: Pt discharged home with bleeding precautions RX phenergan Pt still has RX for pain meds from earlier visit  [facility] 06/01/2013, 1:41 AM

## 2013-06-01 NOTE — Progress Notes (Signed)
Written and verbal d/c instructions given and understanding voiced, In-house interpreter helped with d/c instructions

## 2013-06-01 NOTE — MAU Note (Signed)
Was seen here this am and told baby's heart was not functioning properly. Pt was given options but did not have chance to talk with husband until tonight. He wanted her to come back in to hospital. Having some headaches and back pain. Bleeding about like it was this morning but maybe alittle more.

## 2013-06-01 NOTE — MAU Note (Signed)
Cyctotec placed vaginally by W. Muhammed CNM and pt remained supine for about 

## 2013-06-01 NOTE — MAU Note (Signed)
PT presents with FOB. Using in-house interpreter. Pt went home this am and was unable to immediately talk with FOB about IUFD. When was able to talk with him could not explain exactly her options regarding IUFD. FOB is worried about the pt and wants to know more about the plan of care.

## 2013-06-02 NOTE — MAU Provider Note (Signed)
Chart reviewed and agree with management and plan.  

## 2013-06-18 ENCOUNTER — Encounter: Payer: Self-pay | Admitting: Family Medicine

## 2013-12-29 ENCOUNTER — Inpatient Hospital Stay (HOSPITAL_COMMUNITY)
Admission: AD | Admit: 2013-12-29 | Discharge: 2013-12-29 | Disposition: A | Discharge status: Home / Self Care | Payer: Self-pay | Source: Ambulatory Visit | Attending: Obstetrics & Gynecology | Admitting: Obstetrics & Gynecology

## 2013-12-29 ENCOUNTER — Encounter (HOSPITAL_COMMUNITY): Payer: Self-pay

## 2013-12-29 ENCOUNTER — Inpatient Hospital Stay (HOSPITAL_COMMUNITY): Payer: Self-pay

## 2013-12-29 DIAGNOSIS — D259 Leiomyoma of uterus, unspecified: Secondary | ICD-10-CM

## 2013-12-29 DIAGNOSIS — O26899 Other specified pregnancy related conditions, unspecified trimester: Secondary | ICD-10-CM

## 2013-12-29 DIAGNOSIS — O209 Hemorrhage in early pregnancy, unspecified: Secondary | ICD-10-CM

## 2013-12-29 DIAGNOSIS — R109 Unspecified abdominal pain: Secondary | ICD-10-CM | POA: Insufficient documentation

## 2013-12-29 DIAGNOSIS — O200 Threatened abortion: Secondary | ICD-10-CM

## 2013-12-29 LAB — CBC
HEMATOCRIT: 37.7 % (ref 36.0–46.0)
HEMOGLOBIN: 13.2 g/dL (ref 12.0–15.0)
MCH: 28.1 pg (ref 26.0–34.0)
MCHC: 35 g/dL (ref 30.0–36.0)
MCV: 80.2 fL (ref 78.0–100.0)
Platelets: 228 10*3/uL (ref 150–400)
RBC: 4.7 MIL/uL (ref 3.87–5.11)
RDW: 12.6 % (ref 11.5–15.5)
WBC: 6.8 10*3/uL (ref 4.0–10.5)

## 2013-12-29 LAB — WET PREP, GENITAL
CLUE CELLS WET PREP: NONE SEEN
TRICH WET PREP: NONE SEEN
YEAST WET PREP: NONE SEEN

## 2013-12-29 LAB — URINALYSIS, ROUTINE W REFLEX MICROSCOPIC
BILIRUBIN URINE: NEGATIVE
GLUCOSE, UA: NEGATIVE mg/dL
KETONES UR: NEGATIVE mg/dL
Leukocytes, UA: NEGATIVE
NITRITE: NEGATIVE
PH: 6 (ref 5.0–8.0)
Protein, ur: NEGATIVE mg/dL
Specific Gravity, Urine: 1.025 (ref 1.005–1.030)
Urobilinogen, UA: 0.2 mg/dL (ref 0.0–1.0)

## 2013-12-29 LAB — URINE MICROSCOPIC-ADD ON

## 2013-12-29 LAB — POCT PREGNANCY, URINE: Preg Test, Ur: POSITIVE — AB

## 2013-12-29 LAB — HCG, QUANTITATIVE, PREGNANCY: hCG, Beta Chain, Quant, S: 2619 m[IU]/mL — ABNORMAL HIGH (ref <5)

## 2013-12-29 NOTE — MAU Note (Signed)
Pt presents with complaints of vaginal spotting and thinks she is pregnant. She says that she had a demise in June and wants to make sure everything is ok if she is pregnant. She also states that she has irregular periods and is unsure of her last normal cycle.

## 2013-12-29 NOTE — Discharge Instructions (Signed)
Abdominal Pain During Pregnancy °Abdominal discomfort is common in pregnancy. Most of the time, it does not cause harm. There are many causes of abdominal pain. Some causes are more serious than others. Some of the causes of abdominal pain in pregnancy are easily diagnosed. Occasionally, the diagnosis takes time to understand. Other times, the cause is not determined. Abdominal pain can be a sign that something is very wrong with the pregnancy, or the pain may have nothing to do with the pregnancy at all. For this reason, always tell your caregiver if you have any abdominal discomfort. °CAUSES °Common and harmless causes of abdominal pain include: °· Constipation. °· Excess gas and bloating. °· Round ligament pain. This is pain that is felt in the folds of the groin. °· The position the baby or placenta is in. °· Baby kicks. °· Braxton-Hicks contractions. These are mild contractions that do not cause cervical dilation. °Serious causes of abdominal pain include: °· Ectopic pregnancy. This happens when a fertilized egg implants outside of the uterus. °· Miscarriage. °· Preterm labor. This is when labor starts at less than 37 weeks of pregnancy. °· Placental abruption. This is when the placenta partially or completely separates from the uterus. °· Preeclampsia. This is often associated with high blood pressure and has been referred to as "toxemia in pregnancy." °· Uterine or amniotic fluid infections.  °Causes unrelated to pregnancy include: °· Urinary tract infection. °· Gallbladder stones or inflammation. °· Hepatitis or other liver illness. °· Intestinal problems, stomach flu, food poisoning, or ulcer. °· Appendicitis. °· Kidney (renal) stones. °· Kidney infection (pylonephritis). °HOME CARE INSTRUCTIONS  °For mild pain: °· Do not have sexual intercourse or put anything in your vagina until your symptoms go away completely. °· Get plenty of rest until your pain improves. If your pain does not improve in 1 hour, call  your caregiver. °· Drink clear fluids if you feel nauseous. Avoid solid food as long as you are uncomfortable or nauseous. °· Only take medicine as directed by your caregiver. °· Keep all follow-up appointments with your caregiver. °SEEK IMMEDIATE MEDICAL CARE IF: °· You are bleeding, leaking fluid, or passing tissue from the vagina. °· You have increasing pain or cramping. °· You have persistent vomiting. °· You have painful or bloody urination. °· You have a fever. °· You notice a decrease in your baby's movements. °· You have extreme weakness or feel faint. °· You have shortness of breath, with or without abdominal pain. °· You develop a severe headache with abdominal pain. °· You have abnormal vaginal discharge with abdominal pain. °· You have persistent diarrhea. °· You have abdominal pain that continues even after rest, or gets worse. °MAKE SURE YOU:  °· Understand these instructions. °· Will watch your condition. °· Will get help right away if you are not doing well or get worse. °Document Released: 12/11/2005 Document Revised: 03/04/2012 Document Reviewed: 07/10/2013 °[employer]® Patient Information ©2014 [employer]. ° °

## 2013-12-29 NOTE — MAU Provider Note (Signed)
History     CSN: [id number]  Arrival date and time: 12/29/13 1141   None     Chief Complaint  Patient presents with  . Vaginal Bleeding   HPI  [name] is a 38 y.o. female [phone] at [redacted]w[redacted]d who presents with vaginal bleeding/ spotting. Spanish interpretor at bedside.  Spotting started yesterday, and at the time patient was experiencing no pain. Today she continues to have spotting and began having all over abdominal cramping; "period like cramping". Bleeding is described as bright red; very small amount. Pt is very certain of last menstrual cycle; unable to doppler fetal heart tones today in MAU.   OB History   Grav Para Term Preterm Abortions TAB SAB Ect Mult Living   4 2   1  1   2       Past Medical History  Diagnosis Date  . Medical history non-contributory     Past Surgical History  Procedure Laterality Date  . Appendectomy    . Cholecystectomy    . Cholecystectomy    . Appendectomy      History reviewed. No pertinent family history.  History  Substance Use Topics  . Smoking status: Never Smoker   . Smokeless tobacco: Never Used  . Alcohol Use: No    Allergies: No Known Allergies  Prescriptions prior to admission  Medication Sig Dispense Refill  . Prenatal Vit-Fe Fumarate-FA (PRENATAL MULTIVITAMIN) TABS Take 1 tablet by mouth daily at 12 noon.       Results for orders placed during the hospital encounter of 12/29/13 (from the past 48 hour(s))  URINALYSIS, ROUTINE W REFLEX MICROSCOPIC     Status: Abnormal   Collection Time    12/29/13 11:55 AM      Result Value Range   Color, Urine YELLOW  YELLOW   APPearance CLEAR  CLEAR   Specific Gravity, Urine 1.025  1.005 - 1.030   pH 6.0  5.0 - 8.0   Glucose, UA NEGATIVE  NEGATIVE mg/dL   Hgb urine dipstick LARGE (*) NEGATIVE   Bilirubin Urine NEGATIVE  NEGATIVE   Ketones, ur NEGATIVE  NEGATIVE mg/dL   Protein, ur NEGATIVE  NEGATIVE mg/dL   Urobilinogen, UA 0.2  0.0 - 1.0 mg/dL   Nitrite  NEGATIVE  NEGATIVE   Leukocytes, UA NEGATIVE  NEGATIVE  URINE MICROSCOPIC-ADD ON     Status: Abnormal   Collection Time    12/29/13 11:55 AM      Result Value Range   Squamous Epithelial / LPF FEW (*) RARE   WBC, UA 0-2  <3 WBC/hpf   RBC / HPF 0-2  <3 RBC/hpf  POCT PREGNANCY, URINE     Status: Abnormal   Collection Time    12/29/13 12:00 PM      Result Value Range   Preg Test, Ur POSITIVE (*) NEGATIVE   Comment:            THE SENSITIVITY OF THIS     METHODOLOGY IS >24 mIU/mL  WET PREP, GENITAL     Status: Abnormal   Collection Time    12/29/13  1:00 PM      Result Value Range   Yeast Wet Prep HPF POC NONE SEEN  NONE SEEN   Trich, Wet Prep NONE SEEN  NONE SEEN   Clue Cells Wet Prep HPF POC NONE SEEN  NONE SEEN   WBC, Wet Prep HPF POC FEW (*) NONE SEEN   Comment: MANY BACTERIA SEEN  CBC  Status: None   Collection Time    12/29/13  1:16 PM      Result Value Range   WBC 6.8  4.0 - 10.5 K/uL   RBC 4.70  3.87 - 5.11 MIL/uL   Hemoglobin 13.2  12.0 - 15.0 g/dL   HCT 37.7  36.0 - 46.0 %   MCV 80.2  78.0 - 100.0 fL   MCH 28.1  26.0 - 34.0 pg   MCHC 35.0  30.0 - 36.0 g/dL   RDW 12.6  11.5 - 15.5 %   Platelets 228  150 - 400 K/uL  HCG, QUANTITATIVE, PREGNANCY     Status: Abnormal   Collection Time    12/29/13  1:16 PM      Result Value Range   hCG, Beta Chain, Quant, S 2619 (*) <5 mIU/mL   Comment:              GEST. AGE      CONC.  (mIU/mL)       <=1 WEEK        5 - 50         2 WEEKS       50 - 500         3 WEEKS       100 - 10,000         4 WEEKS     1,000 - 30,000         5 WEEKS     3,500 - 115,000       6-8 WEEKS     12,000 - 270,000        12 WEEKS     15,000 - 220,000                FEMALE AND NON-PREGNANT FEMALE:         LESS THAN 5 mIU/mL    US Ob Transvaginal  12/29/2013   CLINICAL DATA:  Vaginal bleeding. 12 week 5 day gestational age by LMP.  EXAM: OBSTETRIC <14 WK [place] AND TRANSVAGINAL OB US  TECHNIQUE: Both transabdominal and transvaginal ultrasound  examinations were performed for complete evaluation of the gestation as well as the maternal uterus, adnexal regions, and pelvic cul-de-sac. Transvaginal technique was performed to assess early pregnancy.  COMPARISON:  None.  FINDINGS: Intrauterine gestational sac: Visualized/normal in shape.  Yolk sac:  Not visualized  Embryo:  Not visualized  MSD:  7  mm   5 w   2  d  Maternal uterus/adnexae: Probable tiny less than 1 cm fibroid in the anterior lower uterine segment. No mass or free fluid identified. Both ovaries are normal in appearance.  IMPRESSION: Single 5 week intrauterine gestational sac, which is discordant with LMP. Recommend close followup of quantitative beta HCG levels, followup ultrasound in 14 days, to assess viability.   Electronically Signed   By: Earle Gell M.D.   On: 12/29/2013 14:31    Review of Systems  Gastrointestinal: Positive for abdominal pain. Negative for nausea, vomiting, diarrhea and constipation.  Genitourinary: Negative for dysuria, urgency, frequency and hematuria.       No vaginal discharge. + vaginal bleeding. No dysuria.     Physical Exam   Blood pressure 125/76, pulse 90, temperature 98.4 F (36.9 C), resp. rate 16, height 4\' 11"  (1.499 m), weight 79.924 kg (176 lb 3.2 oz), last menstrual period 10/01/2013, SpO2 100.00%, unknown if currently breastfeeding.  Physical Exam  Constitutional: She is oriented to person, place, and time. She appears  well-developed and well-nourished. No distress.  HENT:  Head: Normocephalic.  Eyes: Pupils are equal, round, and reactive to light.  Neck: Neck supple.  Respiratory: Effort normal.  GI: Soft. She exhibits no distension. There is no tenderness. There is no rebound and no guarding.  Genitourinary:  Speculum exam: Vagina - Small amount of dark red vaginal blood in canal, no odor Cervix - small amount of active bleeding  Bimanual exam: Cervix closed, No CMT  Uterus non tender, normal size; gravid  Adnexa non  tender, no masses bilaterally GC/Chlam, wet prep done Chaperone present for exam.   Neurological: She is alert and oriented to person, place, and time.  Skin: Skin is warm. She is not diaphoretic.    MAU Course  Procedures None  MDM Unable to doppler fetal heart tones with doppler O positive blood type  CBC Beta hcg US transvaginal   Assessment and Plan   A: 5 week intrauterine gestational sac, no yolk sac, inconsistent with LMP  Abdominal pain in pregnancy Bleeding in pregnancy; first trimester Threatened miscarriage  Probable uterine fibroid found on [place]   P: Discharge home in stable condition Return in 48 hours for repeat Beta Hcg  Bleeding precautions discussed  Ectopic precautions discussed Return to MAU if symptoms were to worsen Support given   Darrelyn Hillock Burnie Hank, NP  12/29/2013, 7:11 PM

## 2013-12-30 LAB — GC/CHLAMYDIA PROBE AMP
CT Probe RNA: NEGATIVE
GC Probe RNA: NEGATIVE

## 2013-12-31 ENCOUNTER — Encounter (HOSPITAL_COMMUNITY): Payer: Self-pay | Admitting: Obstetrics and Gynecology

## 2013-12-31 ENCOUNTER — Inpatient Hospital Stay (HOSPITAL_COMMUNITY)
Admission: AD | Admit: 2013-12-31 | Discharge: 2013-12-31 | Disposition: A | Discharge status: Home / Self Care | Payer: Self-pay | Source: Ambulatory Visit | Attending: Obstetrics and Gynecology | Admitting: Obstetrics and Gynecology

## 2013-12-31 DIAGNOSIS — O209 Hemorrhage in early pregnancy, unspecified: Secondary | ICD-10-CM | POA: Insufficient documentation

## 2013-12-31 LAB — HCG, QUANTITATIVE, PREGNANCY: hCG, Beta Chain, Quant, S: 2853 m[IU]/mL — ABNORMAL HIGH (ref <5)

## 2013-12-31 MED ORDER — OXYCODONE-ACETAMINOPHEN 5-325 MG PO TABS: 1.0000 | ORAL_TABLET | ORAL | Status: DC | PRN

## 2013-12-31 NOTE — MAU Provider Note (Signed)
Chief Complaint: Follow-up   None    SUBJECTIVE HPI: Desiree Marquez is a 38 y.o. [phone] with unsure LMP presented 12/29/13 with vaginal bleeding and cramping. Quant was 2615 with IUGS c/w [redacted]w[redacted]d, no YS on [place]. Since then she has had increased bleeding heavier than a period and thinks she passed tissue earlier today. Now bleeding like light period. Has menstrual-like cramping. Desired pregnancy.  O pos  Past Medical History  Diagnosis Date  . Medical history non-contributory    OB History  Gravida Para Term Preterm AB SAB TAB Ectopic Multiple Living  4 2   1 1    2     # Outcome Date GA Lbr Len/2nd Weight Sex Delivery Anes PTL Lv  4 CUR           3 SAB           2 PAR      SVD   Y  1 PAR      SVD   Y     Past Surgical History  Procedure Laterality Date  . Appendectomy    . Cholecystectomy    . Cholecystectomy    . Appendectomy     History   Social History  . Marital Status: Married    Spouse Name: N/A    Number of Children: N/A  . Years of Education: N/A   Occupational History  . Not on file.   Social History Main Topics  . Smoking status: Never Smoker   . Smokeless tobacco: Never Used  . Alcohol Use: No  . Drug Use: No  . Sexual Activity: Yes    Birth Control/ Protection: None   Other Topics Concern  . Not on file   Social History Narrative  . No narrative on file   No current facility-administered medications on file prior to encounter.   Current Outpatient Prescriptions on File Prior to Encounter  Medication Sig Dispense Refill  . Prenatal Vit-Fe Fumarate-FA (PRENATAL MULTIVITAMIN) TABS Take 1 tablet by mouth daily at 12 noon.       No Known Allergies  ROS: Pertinent items in HPI  OBJECTIVE Blood pressure 128/81, pulse 72, temperature 98.4 F (36.9 C), temperature source Oral, resp. rate 16, last menstrual period 10/01/2013, SpO2 100.00%. GENERAL: Well-developed, well-nourished female in no acute distress.  ABDOMEN: Soft, non-tender   LAB  RESULTS Results for orders placed during the hospital encounter of 12/31/13 (from the past 24 hour(s))  HCG, QUANTITATIVE, PREGNANCY     Status: Abnormal   Collection Time    12/31/13  1:41 PM      Result Value Range   hCG, Beta Chain, Quant, S 2853 (*) <5 mIU/mL    IMAGING US Ob Transvaginal  12/29/2013   CLINICAL DATA:  Vaginal bleeding. 12 week 5 day gestational age by LMP.  EXAM: OBSTETRIC <14 WK [place] AND TRANSVAGINAL OB US  TECHNIQUE: Both transabdominal and transvaginal ultrasound examinations were performed for complete evaluation of the gestation as well as the maternal uterus, adnexal regions, and pelvic cul-de-sac. Transvaginal technique was performed to assess early pregnancy.  COMPARISON:  None.  FINDINGS: Intrauterine gestational sac: Visualized/normal in shape.  Yolk sac:  Not visualized  Embryo:  Not visualized  MSD:  7  mm   5 w   2  d  Maternal uterus/adnexae: Probable tiny less than 1 cm fibroid in the anterior lower uterine segment. No mass or free fluid identified. Both ovaries are normal in appearance.  IMPRESSION: Single 5 week  intrauterine gestational sac, which is discordant with LMP. Recommend close followup of quantitative beta HCG levels, followup ultrasound in 14 days, to assess viability.   Electronically Signed   By: Earle Gell M.D.   On: 12/29/2013 14:31    MAU COURSE Counseled on high probability failed pregnancy/SAB. Wants expectant management  ASSESSMENT 1. Bleeding in early pregnancy   Probable SAB by hx and minimal change in [place] Discharge home AVS on SAB, bleeding , bleeding precautions Follow-up Information   Follow up with Nursepractioner Mau, NP In 1 week. (repeat blood test)    Contact information:   [phone]        Medication List         oxyCODONE-acetaminophen 5-325 MG per tablet  Commonly known as:  PERCOCET/ROXICET  Take 1 tablet by mouth every 4 (four) hours as needed.     prenatal multivitamin Tabs tablet  Take 1 tablet  by mouth daily at 12 noon.       Follow-up Information   Follow up with Nursepractioner Mau, NP In 1 week. (repeat blood test)    Contact information:   [phone]       Lorene Dy, CNM 12/31/2013  5:00 PM

## 2013-12-31 NOTE — MAU Note (Signed)
Patient to MAU for repeat BHCG. Patient states she is bleeding like a period and has bad pain off and on.

## 2013-12-31 NOTE — Progress Notes (Signed)
Desiree Marquez, CNM discussed results and D/C instrs. Discussed F/U in 1 week. Interpretor Cristie Hem) present for conversation.

## 2014-01-07 ENCOUNTER — Encounter (HOSPITAL_COMMUNITY): Payer: Self-pay | Admitting: General Practice

## 2014-01-07 ENCOUNTER — Inpatient Hospital Stay (HOSPITAL_COMMUNITY): Payer: Self-pay

## 2014-01-07 ENCOUNTER — Inpatient Hospital Stay (HOSPITAL_COMMUNITY)
Admission: AD | Admit: 2014-01-07 | Discharge: 2014-01-07 | Disposition: A | Discharge status: Home / Self Care | Payer: Self-pay | Source: Ambulatory Visit | Attending: Obstetrics and Gynecology | Admitting: Obstetrics and Gynecology

## 2014-01-07 DIAGNOSIS — O0289 Other abnormal products of conception: Secondary | ICD-10-CM | POA: Insufficient documentation

## 2014-01-07 DIAGNOSIS — O020 Blighted ovum and nonhydatidiform mole: Secondary | ICD-10-CM

## 2014-01-07 LAB — HCG, QUANTITATIVE, PREGNANCY: hCG, Beta Chain, Quant, S: 3316 m[IU]/mL — ABNORMAL HIGH (ref <5)

## 2014-01-07 NOTE — MAU Provider Note (Signed)
Chief Complaint: Vaginal Bleeding   First Provider Initiated Contact with Patient 01/07/14 1103     SUBJECTIVE HPI: Desiree Marquez is a 38 y.o. [phone] at [redacted]w[redacted]d by LMP who presents to maternity admissions reporting bright red vaginal bleeding x1 week. She reports the bleeding is like her period, requiring a pad change every 2-3 hours.  She denies abdominal pain, dizziness, or fever/chills.  She was seen in MAU last week and diagnosed with likely miscarriage.   Past Medical History  Diagnosis Date  . Medical history non-contributory    Past Surgical History  Procedure Laterality Date  . Appendectomy    . Cholecystectomy    . Cholecystectomy    . Appendectomy     History   Social History  . Marital Status: Married    Spouse Name: N/A    Number of Children: N/A  . Years of Education: N/A   Occupational History  . Not on file.   Social History Main Topics  . Smoking status: Never Smoker   . Smokeless tobacco: Never Used  . Alcohol Use: No  . Drug Use: No  . Sexual Activity: Yes    Birth Control/ Protection: None   Other Topics Concern  . Not on file   Social History Narrative  . No narrative on file   No current facility-administered medications on file prior to encounter.   Current Outpatient Prescriptions on File Prior to Encounter  Medication Sig Dispense Refill  . Prenatal Vit-Fe Fumarate-FA (PRENATAL MULTIVITAMIN) TABS Take 1 tablet by mouth daily at 12 noon.       No Known Allergies  ROS: Pertinent items in HPI  OBJECTIVE Blood pressure 117/73, pulse 72, temperature 99.3 F (37.4 C), temperature source Oral, resp. rate 18, height 4' 11.02" (1.499 m), weight 79.833 kg (176 lb), last menstrual period 10/01/2013. GENERAL: Well-developed, well-nourished female in no acute distress.  HEENT: Normocephalic HEART: normal rate RESP: normal effort ABDOMEN: Soft, non-tender EXTREMITIES: Nontender, no edema NEURO: Alert and oriented Pelvic exam: Cervix  pink, visually closed, without lesion, small amount bright red bleeding without clots, vaginal walls and external genitalia normal Bimanual exam: Cervix 0/long/high, firm, anterior, neg CMT, uterus nontender, nonenlarged, adnexa without tenderness, enlargement, or mass  LAB RESULTS Results for orders placed during the hospital encounter of 01/07/14 (from the past 24 hour(s))  HCG, QUANTITATIVE, PREGNANCY     Status: Abnormal   Collection Time    01/07/14 10:58 AM      Result Value Range   hCG, Beta Chain, Quant, S 3316 (*) <5 mIU/mL    IMAGING US Ob Transvaginal  01/07/2014   CLINICAL DATA:  Vaginal bleeding, quantitative beta HCG 3,316 today and measured 2,853 on 12/31/2013  EXAM: TRANSVAGINAL OB ULTRASOUND  TECHNIQUE: Transvaginal ultrasound was performed for complete evaluation of the gestation as well as the maternal uterus, adnexal regions, and pelvic cul-de-sac.  COMPARISON:  12/29/2013  FINDINGS: Intrauterine gestational sac: Small gestational sac, irregular shaped  Yolk sac:  Not identified  Embryo:  Not identified  Cardiac Activity: N/A  Heart Rate: N/A bpm  MSD: 6.7  mm   5 w   2  d  Maternal uterus/adnexae:  No definite subchorionic hemorrhage.  Two small uterine leiomyomata, 21 x 19 x 19 mm and 8 x 5 x 7 mm.  Left ovary normal size and morphology, 3.4 x 2.0 x 2.7 cm.  Right ovary normal size and morphology, 2.8 x 2.3 x 2.2 cm.  No adnexal masses or free pelvic  fluid.  IMPRESSION: Small irregular gestational sac is identified within the endometrial canal, demonstrate little increased in size since prior ultrasound 12/29/2013.  No yolk sac or fetal pole identified.  Findings are suspicious but not yet definitive for failed pregnancy. Recommend follow-up US in 7 days for definitive diagnosis. This recommendation follows SRU consensus guidelines: Diagnostic Criteria for Nonviable Pregnancy Early in the First Trimester. [place] Med 2013; 355:9741-63.   Electronically Signed   By: Lavonia Dana  M.D.   On: 01/07/2014 13:41   US Ob Transvaginal  12/29/2013   CLINICAL DATA:  Vaginal bleeding. 12 week 5 day gestational age by LMP.  EXAM: OBSTETRIC <14 WK [place] AND TRANSVAGINAL OB US  TECHNIQUE: Both transabdominal and transvaginal ultrasound examinations were performed for complete evaluation of the gestation as well as the maternal uterus, adnexal regions, and pelvic cul-de-sac. Transvaginal technique was performed to assess early pregnancy.  COMPARISON:  None.  FINDINGS: Intrauterine gestational sac: Visualized/normal in shape.  Yolk sac:  Not visualized  Embryo:  Not visualized  MSD:  7  mm   5 w   2  d  Maternal uterus/adnexae: Probable tiny less than 1 cm fibroid in the anterior lower uterine segment. No mass or free fluid identified. Both ovaries are normal in appearance.  IMPRESSION: Single 5 week intrauterine gestational sac, which is discordant with LMP. Recommend close followup of quantitative beta HCG levels, followup ultrasound in 14 days, to assess viability.   Electronically Signed   By: Earle Gell M.D.   On: 12/29/2013 14:31    ASSESSMENT 1. Blighted ovum     PLAN Consult Dr Elly Modena Offered pt cytotec.  Pt declined today.   Discharge home F/U in 1 week in clinic--appointment time given Return to MAU as needed-- bleeding precautions given    Medication List         prenatal multivitamin Tabs tablet  Take 1 tablet by mouth daily at 12 noon.           Follow-up Information   Follow up with [facility]. (Venga a la clnica a las 12:45 pm el mircoles 21 de enero para los laboratorios.  Come to clinic at 12:45 pm on Wednesday, January 21 for labs. )    Specialty:  Obstetrics and Gynecology   Contact information:   [place] [address] [place] Certified Nurse-Midwife 01/07/2014  2:52 PM

## 2014-01-07 NOTE — MAU Note (Signed)
Pt in for routine repeat quant but also states that she has been bleeding heavily for a week. changing a pad 4x/day and passing clots. Has no c/o pain.

## 2014-01-07 NOTE — Discharge Instructions (Signed)
Embarazo anembrinico  (Blighted Ovum) Un huevo sin embrin Ecologist) se produce cuando un huevo fertilizado (embrin) se adhiere a la pared uterina, pero el embrin no se desarrolla. El saco embrionario (placenta) sigue creciendo a pesar de que el embrin no crece crece ni se desarrolla.La hormona del embarazo se sigue produciendo poquer la placenta se ha formado. Esto dar como resultado una prueba de Media planner positiva a pesar de Best boy un embarazo anormal. Esto ocurre dentro del primer trimestre, a veces antes que una mujer sepa que est embarazada.  CAUSAS  Generalmente es el resultado de problemas cromosmicos. Una de las causas puede ser una divisin celular anormal o esperma u vulos de baja calidad.  SNTOMAS  Al principio, pueden experimentarse signos de embarazo, como por ejemplo:   Falta del periodo menstrual.  Fatiga.  Ganas de vomitar (nuseas).  Dolor en las [place].  Test de embarazo positivo. Luego, puede haber signos de aborto, como:   Clicos abdominales.  Sangrado o hemorragia vaginal.  Perodo menstrual ms abundante que lo normal. DIAGNSTICO  El diagnstico se realiza con una ecografia que [place] un tero o un saco gestacional vaco.  TRATAMIENTO  El mdico la ayudar a Teacher, adult education cul es el mejor tratamiento para usted. El tratamiento puede ser:   Dejar que el cuerpo elimine naturalmente el tejido del embarazo anembrinico.  Tomar medicamentos para Product/process development scientist.  Realizar un procedimiento llamado dilatacin y curetaje (D & C) para eliminar los tejidos de la placenta.  La D & C puede ser til si desea examinar el tejido para determinar la causa del aborto. Hable con su mdico acerca de los riesgos que implica este procedimiento.  INSTRUCCIONES PARA EL CUIDADO EN EL HOGAR   Haga un seguimiento con su mdico para asegurarse de que la hormona del embarazo vuelve a cero.  Espere por lo menos 1 a 3 ciclos menstruales regulares antes  de intentar quedar embarazada de nuevo, o segn lo recomendado por su mdico. SOLICITE ATENCIN MDICA DE INMEDIATO SI:   Siente un dolor abdominal cada vez ms intenso.  Tiene una hemorragia abundante o [place] 1 a 2 apsitos por hora durante ms de 2 horas.  Se siente confundida, se marea o pierde el conocimiento. Document Released: 08/23/2011 Document Revised: 03/04/2012 [facility] Patient Information 2014 [place], [place].

## 2014-01-08 NOTE — MAU Provider Note (Signed)
Attestation of Attending Supervision of Advanced Practitioner (CNM/NP): Evaluation and management procedures were performed by the Advanced Practitioner under my supervision and collaboration.  I have reviewed the Advanced Practitioner's note and chart, and I agree with the management and plan.  Ardelle Haliburton 01/08/2014 1:43 PM

## 2014-01-14 ENCOUNTER — Other Ambulatory Visit: Payer: Self-pay

## 2014-01-14 DIAGNOSIS — O020 Blighted ovum and nonhydatidiform mole: Secondary | ICD-10-CM

## 2014-01-15 LAB — HCG, QUANTITATIVE, PREGNANCY: hCG, Beta Chain, Quant, S: 1907.4 m[IU]/mL

## 2014-01-19 ENCOUNTER — Telehealth: Payer: Self-pay | Admitting: General Practice

## 2014-01-19 ENCOUNTER — Encounter: Payer: Self-pay | Admitting: General Practice

## 2014-01-19 NOTE — Telephone Encounter (Signed)
Called patient and a message stated this person is not accepting incoming calls at this time. Will send patient letter.

## 2014-01-19 NOTE — Telephone Encounter (Signed)
Message copied by Shelly Coss on Mon Jan 19, 2014 11:46 AM ------      Message from: Verita Schneiders A      Created: Thu Jan 15, 2014  5:14 PM       Repeat HCG draw in one week.  Please call to inform patient of results and recommendations.       ------

## 2014-01-21 ENCOUNTER — Other Ambulatory Visit: Payer: Self-pay

## 2014-01-21 DIAGNOSIS — O020 Blighted ovum and nonhydatidiform mole: Secondary | ICD-10-CM

## 2014-01-22 LAB — HCG, QUANTITATIVE, PREGNANCY: HCG, BETA CHAIN, QUANT, S: 792.3 m[IU]/mL

## 2014-01-27 ENCOUNTER — Encounter (HOSPITAL_COMMUNITY): Payer: Self-pay

## 2014-01-27 ENCOUNTER — Inpatient Hospital Stay (HOSPITAL_COMMUNITY)
Admission: AD | Admit: 2014-01-27 | Discharge: 2014-01-27 | Disposition: A | Discharge status: Home / Self Care | Payer: Self-pay | Source: Ambulatory Visit | Attending: Obstetrics & Gynecology | Admitting: Obstetrics & Gynecology

## 2014-01-27 DIAGNOSIS — O039 Complete or unspecified spontaneous abortion without complication: Secondary | ICD-10-CM | POA: Insufficient documentation

## 2014-01-27 LAB — HCG, QUANTITATIVE, PREGNANCY: HCG, BETA CHAIN, QUANT, S: 264 m[IU]/mL — AB (ref <5)

## 2014-01-27 NOTE — MAU Provider Note (Signed)
HPI:  [name] is a 38 y.o. female [phone] who presents with MAU with concerns regarding a recent miscarriage. The patient was seen here in MAU on 1/14 for bright red vaginal bleeding; US showed blighted ovum. Pt was seen in the clinic on 1/21 and quant was 1907, pt seen again in the clinic on 1/28 and quant was 792. The patient never received a call from anyone explaining the decrease in quants and what it means.  The patient came here for further explanation. Quant in MAU today 2/3 was 264.  Interpretor at bedside. Pt currently denies pain or bleeding.   Objective:  GENERAL: Well-developed, well-nourished female in no acute distress.  HEENT: Normocephalic, atraumatic.   LUNGS: Effort normal HEART: Regular rate  SKIN: Warm, dry and without erythema PSYCH: Normal mood and affect  Filed Vitals:   01/27/14 1140  BP: 123/76  Pulse: 80  Temp: 98.9 F (37.2 C)  Resp: 16   Results for orders placed during the hospital encounter of 01/27/14 (from the past 48 hour(s))  HCG, QUANTITATIVE, PREGNANCY     Status: Abnormal   Collection Time    01/27/14 11:55 AM      Result Value Range   hCG, Beta Chain, Quant, S 264 (*) <5 mIU/mL   Comment:              GEST. AGE      CONC.  (mIU/mL)       <=1 WEEK        5 - 50         2 WEEKS       50 - 500         3 WEEKS       100 - 10,000         4 WEEKS     1,000 - 30,000         5 WEEKS     3,500 - 115,000       6-8 WEEKS     12,000 - 270,000        12 WEEKS     15,000 - 220,000                FEMALE AND NON-PREGNANT FEMALE:         LESS THAN 5 mIU/mL   MDM:  Consulted with Dr. Harolyn Rutherford; pt to follow up in the clinic in one week.  Explained/discussed at length with the patient using the hospital interpretor the results of the beta hcg today and the plan of care from this point on. I explained that the numbers tell us that everything is progressing as we would expect. Pt felt very reassured today following this visit. Pt is to follow  up in the clinic in one week for repeat beta hcg.   Assessment:  SAB Appropriate decline in beta hcg levels Counseling; pt concerned about plan of care.   Plan:  Discharge home in stable condition Follow up in the clinic in one week for beta hcg level Return to MAU with further concerns.   Darrelyn Hillock Phinneas Shakoor, NP 01/27/2014 1:28 PM

## 2014-01-27 NOTE — MAU Provider Note (Signed)
Attestation of Attending Supervision of Advanced Practitioner (PA/CNM/NP): Evaluation and management procedures were performed by the Advanced Practitioner under my supervision and collaboration.  I have reviewed the Advanced Practitioner's note and chart, and I agree with the management and plan.  Syrai Gladwin, MD, FACOG Attending Obstetrician & Gynecologist Faculty Practice, [facility]  

## 2014-01-27 NOTE — MAU Note (Signed)
Patient has been having BHCG's at the clinic, last one 1-28. States she took a home pregnancy test that was still positive. Explained to patient with a translator that she still has a BHCG level that would make a pregnanct test positive.

## 2014-01-27 NOTE — MAU Note (Signed)
Patient states she had a SAB recently. States she has had light bleeding and is now having abdominal and back pain.

## 2014-02-04 ENCOUNTER — Other Ambulatory Visit: Payer: Self-pay

## 2014-02-04 DIAGNOSIS — O0289 Other abnormal products of conception: Secondary | ICD-10-CM

## 2014-02-05 LAB — HCG, QUANTITATIVE, PREGNANCY: HCG, BETA CHAIN, QUANT, S: 95.1 m[IU]/mL

## 2014-02-06 ENCOUNTER — Encounter: Payer: Self-pay | Admitting: *Deleted

## 2014-02-10 ENCOUNTER — Telehealth: Payer: Self-pay | Admitting: *Deleted

## 2014-02-10 NOTE — Telephone Encounter (Addendum)
Message copied by Samuel Germany on Tue Feb 10, 2014  2:25 PM ------      Message from: Verita Schneiders A      Created: Fri Feb 06, 2014  9:17 AM       Return in two weeks for repeat BHCG and follow up with any provider to discuss future pregnancy planning, contraception, routine GYN care etc. ------Need to call patient with Spanish interpreter.

## 2014-02-10 NOTE — Telephone Encounter (Signed)
[place] with interpreter and notified of need for follow up visit and repeat bhcg- appointment given for 02/20/14

## 2014-02-20 ENCOUNTER — Ambulatory Visit: Payer: Self-pay | Admitting: Obstetrics & Gynecology

## 2014-10-26 ENCOUNTER — Encounter (HOSPITAL_COMMUNITY): Payer: Self-pay

## 2014-11-27 ENCOUNTER — Encounter (HOSPITAL_COMMUNITY): Payer: Self-pay | Admitting: *Deleted

## 2014-11-27 ENCOUNTER — Inpatient Hospital Stay (HOSPITAL_COMMUNITY)
Admission: AD | Admit: 2014-11-27 | Discharge: 2014-11-27 | Disposition: A | Discharge status: Home / Self Care | Payer: Self-pay | Source: Ambulatory Visit | Attending: Obstetrics and Gynecology | Admitting: Obstetrics and Gynecology

## 2014-11-27 DIAGNOSIS — B373 Candidiasis of vulva and vagina: Secondary | ICD-10-CM | POA: Insufficient documentation

## 2014-11-27 DIAGNOSIS — Z3A26 26 weeks gestation of pregnancy: Secondary | ICD-10-CM | POA: Insufficient documentation

## 2014-11-27 DIAGNOSIS — O98812 Other maternal infectious and parasitic diseases complicating pregnancy, second trimester: Secondary | ICD-10-CM | POA: Insufficient documentation

## 2014-11-27 DIAGNOSIS — B3731 Acute candidiasis of vulva and vagina: Secondary | ICD-10-CM

## 2014-11-27 HISTORY — DX: Headache: R51

## 2014-11-27 HISTORY — DX: Headache, unspecified: R51.9

## 2014-11-27 LAB — WET PREP, GENITAL
Clue Cells Wet Prep HPF POC: NONE SEEN
Trich, Wet Prep: NONE SEEN
YEAST WET PREP: NONE SEEN

## 2014-11-27 MED ORDER — FLUCONAZOLE 150 MG PO TABS: ORAL_TABLET | ORAL | Status: DC

## 2014-11-27 NOTE — Discharge Instructions (Signed)
Vulvovaginitis Candidisica (Candidal Vulvovaginitis) La vulvovaginitis candidisica es una infeccin de la vagina y la vulva. La vulva es la piel que rodea la abertura de la vagina. Puede causar picazn y [place] de y alrededor de la vagina.  CUIDADOS EN EL HOGAR  Slo tome medicamentos como lo indique su mdico.  No mantenga relaciones sexuales hasta que la infeccin haya curado o segn le indique el mdico.  Practique sexo seguro.  Informe a su compaero sexual acerca de su infeccin.  No tome duchas vaginales ni use tampones.  Use ropa interior de algodn. No utilice pantalones ni pantimedias ajustados.  Coma yogur. Esto puede ayudar a tratar y [place] infecciones por cndida. SOLICITE AYUDA DE INMEDIATO SI:   Tiene fiebre.  [place], o si no mejora luego de 3 das.  Tiene malestar, irritacin, o picazn en la zona de la vagina o la vulva.  Siente dolor en al [employer].  Comienza a sentir dolor abdominal. ASEGRESE DE QUE:  Comprende estas instrucciones.  Controlar su enfermedad.  Solicitar ayuda de inmediato si no mejora o empeora. Document Released: 01/13/2011 Document Revised: 12/16/2013 [employer] Patient Information 2015 [place]. This information is not intended to replace advice given to you by your health care provider. Make sure you discuss any questions you have with your health care provider.  Segundo trimestre de Media planner (Second Trimester of Pregnancy) El segundo trimestre va desde la semana13 hasta la 42, desde el cuarto hasta el sexto mes, y suele ser el momento en el que mejor se siente. Su organismo se ha adaptado a Public relations account executive y comienza a Print production planner. En general, las nuseas matutinas han disminuido o han desaparecido completamente, p El segundo trimestre es tambin la poca en la que el feto se desarrolla rpidamente. Hacia el final del sexto mes, el feto  mide aproximadamente 9pulgadas (23cm) y pesa alrededor de 1 libras (700g). Es probable que sienta que el beb se Software engineer (da pataditas) entre las 23 y 20semanas del Media planner. CAMBIOS EN EL ORGANISMO Su organismo atraviesa por muchos cambios durante el [place], y estos varan de Ardelia Mems mujer a Theatre manager.   Seguir [employer]. Notar que la parte baja del abdomen sobresale.  Podrn aparecer las primeras [employer] caderas, el abdomen y las [place].  Es posible que tenga dolores de cabeza que pueden aliviarse con los medicamentos que su mdico autorice.  Tal vez tenga necesidad de orinar con ms frecuencia porque el feto est ejerciendo presin Field seismologist.  Debido al Glennis Brink podr sentir Victorio Palm estomacal con frecuencia.  Puede estar estreida, ya que ciertas hormonas enlentecen los movimientos de los msculos que [employer] desechos a travs de los intestinos.  Pueden aparecer hemorroides o abultarse e hincharse las venas (venas varicosas).  Puede tener dolor de espalda que se debe al [employer] de peso y a que las hormonas del Scientist, research (life sciences) las articulaciones entre los huesos de la pelvis, y Civil Service fast streamer consecuencia de la modificacin del peso y los msculos que mantienen el equilibrio.  Las [employer] seguirn creciendo y Teaching laboratory technician.  Las Production manager y estar sensibles al cepillado y al hilo dental.  Pueden aparecer zonas oscuras o manchas (cloasma, mscara del Media planner) en el rostro que probablemente se atenuarn despus del nacimiento del beb.  Es posible que se forme una lnea oscura desde el ombligo hasta la zona del pubis (linea nigra) que probablemente se atenuarn despus del nacimiento del beb.  Letitia Caul  vez haya cambios en el cabello que pueden incluir su engrosamiento, crecimiento rpido y cambios en la textura. Adems, a algunas mujeres se les cae el cabello durante o despus del embarazo, o tienen el cabello seco o fino. Lo ms probable es que el cabello se le normalice  despus del nacimiento del beb. QU DEBE ESPERAR EN LAS CONSULTAS PRENATALES Durante una visita prenatal de rutina:  La pesarn para asegurarse de que usted y el feto estn creciendo normalmente.  Le tomarn la presin arterial.  Le medirn el abdomen para controlar el desarrollo del beb.  Se escucharn los latidos cardacos fetales.  Se evaluarn los resultados de los estudios solicitados en visitas anteriores. El mdico puede preguntarle lo siguiente:  Cmo se siente.  Si siente los movimientos del beb.  Si ha tenido sntomas anormales, como prdida de lquido, [place], dolores de cabeza intensos o clicos abdominales.  Si tiene [employer]. Otros estudios que podrn realizarse durante el segundo trimestre incluyen lo siguiente:  Anlisis de sangre para detectar:  Concentraciones de hierro bajas (anemia).  Diabetes gestacional (entre la semana 24 y la 60).  Anticuerpos Rh.  Anlisis de orina para detectar infecciones, diabetes o protenas en la orina.  Una ecografa para confirmar que el beb crece y se desarrolla correctamente.  Una amniocentesis para diagnosticar posibles problemas genticos.  Estudios del feto para descartar espina bfida y sndrome de [place]. INSTRUCCIONES PARA EL CUIDADO EN EL HOGAR   Evite fumar, consumir hierbas, beber alcohol y tomar frmacos que no le hayan recetado. Estas sustancias qumicas afectan la formacin y el desarrollo del beb.  [place] mdico en relacin con el uso de medicamentos. Durante el embarazo, hay medicamentos que son seguros de tomar y otros que no.  Haga actividad fsica solo en la forma indicada por el mdico. Sentir clicos uterinos es un buen signo para Ambulance person actividad fsica.  Contine comiendo alimentos que sanos con regularidad.  Use un sostn que le brinde buen soporte si le [employer].  No se d baos de inmersin en agua caliente, baos turcos ni saunas.  Colquese el  cinturn de seguridad cuando conduzca.  No coma carne cruda ni queso sin cocinar; evite el contacto con las bandejas sanitarias de los gatos y la tierra que estos animales usan. Estos elementos contienen grmenes que pueden causar defectos congnitos en el beb.  [place].  Si est estreida, pruebe un laxante suave (si el mdico lo autoriza). Consuma ms alimentos ricos en fibra, como vegetales y frutas frescos y Psychologist, prison and probation services. Beba gran cantidad de lquido para mantener la orina de tono claro o color amarillo plido.  Dese baos de asiento con agua tibia para Best boy o las molestias causadas por las hemorroides. Use una crema para las hemorroides si el mdico la autoriza.  Si tiene venas varicosas, use medias de descanso. Eleve los pies durante 59minutos, 3 o 4veces por da. Limite la cantidad de sal en su dieta.  No levante objetos pesados, use zapatos de tacones bajos y mantenga una buena postura.  Descanse con las piernas elevadas si tiene calambres o dolor de cintura.  Visite a su dentista si an no lo ha Quarry manager. Use un cepillo de dientes blando para higienizarse los dientes y psese el hilo dental con suavidad.  Puede seguir [employer], a menos que el mdico le indique lo contrario.  Concurra a todas las visitas prenatales segn las indicaciones de su  mdico. SOLICITE ATENCIN MDICA SI:   Tiene mareos.  Siente clicos leves, presin en la pelvis o dolor persistente en el abdomen.  Tiene nuseas, vmitos o diarrea persistentes.  Tiene secrecin vaginal con mal olor.  Siente dolor al [employer]. SOLICITE ATENCIN MDICA DE INMEDIATO SI:   Tiene fiebre.  Tiene una prdida de lquido por la vagina.  Tiene sangrado o pequeas prdidas vaginales.  Siente dolor intenso o clicos en el abdomen.  Sube o baja de peso rpidamente.  Tiene dificultad para respirar y siente dolor de pecho.  Sbitamente  se le hinchan mucho el rostro, las [place], los tobillos, los pies o las piernas.  No ha sentido los movimientos del beb durante [place].  Siente un dolor de cabeza intenso que no se alivia con medicamentos.  Hay cambios en la visin. Document Released: 09/20/2005 Document Revised: 12/16/2013 [facility] Patient Information 2015 [place]. This information is not intended to replace advice given to you by your health care provider. Make sure you discuss any questions you have with your health care provider.

## 2014-11-27 NOTE — MAU Provider Note (Signed)
  History  Chief Complaint:  Vaginal Itching and Vaginal Discharge  Desiree Marquez is a 38 y.o. [phone] female at [redacted]w[redacted]d presenting with vaginal discharge and itching for the past three days.  Also reports some "bumps on vagina".  Reports that these bumps are painful and itchy also.  States she had this over a week ago and self treated with some vaginal cream that she brought over-the-counter.  The symptoms went away with use of this OTC medication but then it returned.   Reports active fetal movement, contractions: none, vaginal bleeding: none, membranes: intact. Denies uti s/s, abnormal/malodorous vag d/c or vulvovaginal itching/irritation.   Prenatal care at health department that has not yet been established.    Obstetrical History: OB History    Gravida Para Term Preterm AB TAB SAB Ectopic Multiple Living   5 2 2  2  2   2       Past Medical History: Past Medical History  Diagnosis Date  . Medical history non-contributory   . Headache     Past Surgical History: Past Surgical History  Procedure Laterality Date  . Appendectomy    . Cholecystectomy    . Cholecystectomy    . Appendectomy      Social History: History   Social History  . Marital Status: Single    Spouse Name: N/A    Number of Children: N/A  . Years of Education: N/A   Social History Main Topics  . Smoking status: Never Smoker   . Smokeless tobacco: Never Used  . Alcohol Use: No  . Drug Use: No  . Sexual Activity: Yes    Birth Control/ Protection: None   Other Topics Concern  . None   Social History Narrative    Allergies: No Known Allergies  Prescriptions prior to admission  Medication Sig Dispense Refill Last Dose  . Prenatal Vit-Fe Fumarate-FA (PRENATAL MULTIVITAMIN) TABS Take 1 tablet by mouth daily at 12 noon.   11/26/2014 at Unknown time    Review of Systems  Pertinent pos/neg as indicated in HPI  Physical Exam  Blood pressure 125/70, pulse 79, temperature 98.1 F (36.7 C),  temperature source Oral, resp. rate 18, height 4\' 9"  (1.448 m), last menstrual period 10/01/2013, unknown if currently breastfeeding. General appearance: alert, cooperative and no distress Lungs: clear to auscultation bilaterally, normal effort Heart: regular rate and rhythm Abdomen: gravid, soft, non-tender Extremities: no edema  Spec exam: medium consistency, yellowish-white vaginal discharge noted at vaginal introitus and inside vaginal walls. Cultures/Specimens: wet prep, Gc/Chlamydia, HSV culture     Fetal monitoring: FHR: 135 bpm, variability: moderate,  Accelerations: Present,  decelerations:  Absent Uterine activity: none  MAU Course  Lab work Speculum exam culltures  Labs:  No results found for this or any previous visit (from the past 24 hour(s)).  Imaging:  n/a  Assessment and Plan  A:  [redacted]w[redacted]d [facility]  [id number]  Vaginal candida  Cat 1 FHR P:  Discharge home  Diflucan 150mg  - take one today and one in three days  Reviewed safe sex practices  Schedule anatomy US- outpt  Establish prenatal care at health department ASAP   [place] 12/4/201511:42 AM  I have seen this patient and agree with the above midwife student's note.  She did have two 0.5 cm or smaller lesions on right and left labia, raised, without broken skin, but painful to palpation.  Consistent with folliculitis but HSV cultures obtained.  LEFTWICH-KIRBY, [place] Certified Nurse-Midwife

## 2014-11-27 NOTE — MAU Note (Signed)
Thinks she has a vag infection.  Vag itching started last night, noted little blisters. Never had anything like this before.

## 2014-11-28 LAB — GC/CHLAMYDIA PROBE AMP
CT Probe RNA: NEGATIVE
GC PROBE AMP APTIMA: NEGATIVE

## 2014-11-30 LAB — HERPES SIMPLEX VIRUS CULTURE: Culture: NOT DETECTED

## 2014-11-30 LAB — HSV 1 ANTIBODY, IGG: HSV 1 Glycoprotein G Ab, IgG: 10.06 IV — ABNORMAL HIGH

## 2014-11-30 LAB — HSV 2 ANTIBODY, IGG

## 2014-12-01 ENCOUNTER — Ambulatory Visit (HOSPITAL_COMMUNITY)
Admit: 2014-12-01 | Discharge: 2014-12-01 | Disposition: A | Discharge status: Home / Self Care | Payer: Self-pay | Attending: Advanced Practice Midwife | Admitting: Advanced Practice Midwife

## 2014-12-01 DIAGNOSIS — Z3A27 27 weeks gestation of pregnancy: Secondary | ICD-10-CM | POA: Insufficient documentation

## 2014-12-01 DIAGNOSIS — Z3689 Encounter for other specified antenatal screening: Secondary | ICD-10-CM | POA: Insufficient documentation

## 2014-12-01 DIAGNOSIS — B373 Candidiasis of vulva and vagina: Secondary | ICD-10-CM

## 2014-12-01 DIAGNOSIS — Z36 Encounter for antenatal screening of mother: Secondary | ICD-10-CM | POA: Insufficient documentation

## 2014-12-01 DIAGNOSIS — B3731 Acute candidiasis of vulva and vagina: Secondary | ICD-10-CM

## 2014-12-01 LAB — US OB COMP + 14 WK

## 2014-12-02 ENCOUNTER — Telehealth: Payer: Self-pay | Admitting: *Deleted

## 2014-12-02 ENCOUNTER — Telehealth (HOSPITAL_COMMUNITY): Payer: Self-pay | Admitting: Advanced Practice Midwife

## 2014-12-02 ENCOUNTER — Encounter: Payer: Self-pay | Admitting: *Deleted

## 2014-12-02 NOTE — Telephone Encounter (Signed)
Called Desiree Marquez with [facility] and gave her results of ob ultrasound ( normal) and need for follow up in 6-8 weeks.  She states she had planned to try to get a prenatal appointment with Health department, but hasn't called yet. I explained to her that since she is over 20 weeks they will not accept her and we have made an appointment for her to start prenatal care in our clinic- gave her appointment date/time.

## 2014-12-02 NOTE — Telephone Encounter (Signed)
Telephone call to patient using [place] [phone] regarding positive HSV 1 glycoprotein.  Patient was not in, left message for her to call.

## 2014-12-02 NOTE — Telephone Encounter (Signed)
-----   Message from Berlinda Last sent at 12/01/2014  9:23 AM EST ----- Regarding: U/S results  We have just completed a 2nd or 3rd trimester outpatient ultrasound scheduled for a patient who was seen in MAU.  Please call the patient with the results.  Pt has not started prenatal care I have called the clinic this morning to inquire about pt starting prenatal care with clinic, Nurse was going to make her an appointment and said it would be given to her when she is called with results.

## 2014-12-09 ENCOUNTER — Encounter: Payer: Self-pay | Admitting: Advanced Practice Midwife

## 2014-12-09 ENCOUNTER — Ambulatory Visit (INDEPENDENT_AMBULATORY_CARE_PROVIDER_SITE_OTHER): Payer: Self-pay | Admitting: Advanced Practice Midwife

## 2014-12-09 VITALS — BP 124/73 | HR 72 | Temp 97.7°F | Wt 181.2 lb

## 2014-12-09 DIAGNOSIS — O0932 Supervision of pregnancy with insufficient antenatal care, second trimester: Secondary | ICD-10-CM

## 2014-12-09 DIAGNOSIS — Z3483 Encounter for supervision of other normal pregnancy, third trimester: Secondary | ICD-10-CM

## 2014-12-09 DIAGNOSIS — O1490 Unspecified pre-eclampsia, unspecified trimester: Secondary | ICD-10-CM | POA: Insufficient documentation

## 2014-12-09 DIAGNOSIS — Z23 Encounter for immunization: Secondary | ICD-10-CM

## 2014-12-09 DIAGNOSIS — Z3493 Encounter for supervision of normal pregnancy, unspecified, third trimester: Secondary | ICD-10-CM

## 2014-12-09 LAB — POCT URINALYSIS DIP (DEVICE)
BILIRUBIN URINE: NEGATIVE
Glucose, UA: NEGATIVE mg/dL
Ketones, ur: NEGATIVE mg/dL
Leukocytes, UA: NEGATIVE
Nitrite: NEGATIVE
Protein, ur: NEGATIVE mg/dL
SPECIFIC GRAVITY, URINE: 1.02 (ref 1.005–1.030)
Urobilinogen, UA: 0.2 mg/dL (ref 0.0–1.0)
pH: 7 (ref 5.0–8.0)

## 2014-12-09 MED ORDER — TETANUS-DIPHTH-ACELL PERTUSSIS 5-2.5-18.5 LF-MCG/0.5 IM SUSP
0.5000 mL | Freq: Once | INTRAMUSCULAR | Status: AC
  Administered 2014-12-09: 0.5 mL via INTRAMUSCULAR

## 2014-12-09 NOTE — Progress Notes (Signed)
28 week labs today. Pt would like tdap. Pt declines flu shot. Medicaid Home form completed today.

## 2014-12-09 NOTE — Patient Instructions (Signed)
Tercer trimestre de embarazo (Third Trimester of Pregnancy) El tercer trimestre va desde la semana29 hasta la 42, desde el sptimo hasta el noveno mes, y es la poca en la que el feto crece ms rpidamente. Hacia el final del noveno mes, el feto mide alrededor de 20pulgadas (45cm) de largo y pesa entre 6 y 10 libras (2,700 y 4,500kg).  CAMBIOS EN EL ORGANISMO Su organismo atraviesa por muchos cambios durante el embarazo, y estos varan de una mujer a otra.   Seguir aumentando de peso. Es de esperar que aumente entre 25 y 35libras (11 y 16kg) hacia el final del embarazo.  Podrn aparecer las primeras estras en las caderas, el abdomen y las mamas.  Puede tener necesidad de orinar con ms frecuencia porque el feto baja hacia la pelvis y ejerce presin sobre la vejiga.  Debido al embarazo podr sentir acidez estomacal con frecuencia.  Puede estar estreida, ya que ciertas hormonas enlentecen los movimientos de los msculos que empujan los desechos a travs de los intestinos.  Pueden aparecer hemorroides o abultarse e hincharse las venas (venas varicosas).  Puede sentir dolor plvico debido al aumento de peso y a que las hormonas del embarazo relajan las articulaciones entre los huesos de la pelvis. El dolor de espalda puede ser consecuencia de la sobrecarga de los msculos que soportan la postura.  Tal vez haya cambios en el cabello que pueden incluir su engrosamiento, crecimiento rpido y cambios en la textura. Adems, a algunas mujeres se les cae el cabello durante o despus del embarazo, o tienen el cabello seco o fino. Lo ms probable es que el cabello se le normalice despus del nacimiento del beb.  Las mamas seguirn creciendo y le dolern. A veces, puede haber una secrecin amarilla de las mamas llamada calostro.  El ombligo puede salir hacia afuera.  Puede sentir que le falta el aire debido a que se expande el tero.  Puede notar que el feto "baja" o lo siente ms bajo, en  el abdomen.  Puede tener una prdida de secrecin mucosa con sangre. Esto suele ocurrir en el trmino de unos pocos das a una semana antes de que comience el trabajo de parto.  El cuello del tero se vuelve delgado y blando (se borra) cerca de la fecha de parto. QU DEBE ESPERAR EN LOS EXMENES PRENATALES  Le harn exmenes prenatales cada 2semanas hasta la semana36. A partir de ese momento le harn exmenes semanales. Durante una visita prenatal de rutina:  La pesarn para asegurarse de que usted y el feto estn creciendo normalmente.  Le tomarn la presin arterial.  Le medirn el abdomen para controlar el desarrollo del beb.  Se escucharn los latidos cardacos fetales.  Se evaluarn los resultados de los estudios solicitados en visitas anteriores.  Le revisarn el cuello del tero cuando est prxima la fecha de parto para controlar si este se ha borrado. Alrededor de la semana36, el mdico le revisar el cuello del tero. Al mismo tiempo, realizar un anlisis de las secreciones del tejido vaginal. Este examen es para determinar si hay un tipo de bacteria, estreptococo Grupo B. El mdico le explicar esto con ms detalle. El mdico puede preguntarle lo siguiente:  Cmo le gustara que fuera el parto.  Cmo se siente.  Si siente los movimientos del beb.  Si ha tenido sntomas anormales, como prdida de lquido, sangrado, dolores de cabeza intensos o clicos abdominales.  Si tiene alguna pregunta. Otros exmenes o estudios de deteccin que pueden realizarse   durante el tercer trimestre incluyen lo siguiente:  Anlisis de sangre para controlar las concentraciones de hierro (anemia).  Controles fetales para determinar su salud, nivel de actividad y crecimiento. Si tiene alguna enfermedad o hay problemas durante el embarazo, le harn estudios. FALSO TRABAJO DE PARTO Es posible que sienta contracciones leves e irregulares que finalmente desaparecen. Se llaman contracciones de  Braxton Hicks o falso trabajo de parto. Las contracciones pueden durar horas, das o incluso semanas, antes de que el verdadero trabajo de parto se inicie. Si las contracciones ocurren a intervalos regulares, se intensifican o se hacen dolorosas, lo mejor es que la revise el mdico.  SIGNOS DE TRABAJO DE PARTO   Clicos de tipo menstrual.  Contracciones cada 5minutos o menos.  Contracciones que comienzan en la parte superior del tero y se extienden hacia abajo, a la zona inferior del abdomen y la espalda.  Sensacin de mayor presin en la pelvis o dolor de espalda.  Una secrecin de mucosidad acuosa o con sangre que sale de la vagina. Si tiene alguno de estos signos antes de la semana37 del embarazo, llame a su mdico de inmediato. Debe concurrir al hospital para que la controlen inmediatamente. INSTRUCCIONES PARA EL CUIDADO EN EL HOGAR   Evite fumar, consumir hierbas, beber alcohol y tomar frmacos que no le hayan recetado. Estas sustancias qumicas afectan la formacin y el desarrollo del beb.  Siga las indicaciones del mdico en relacin con el uso de medicamentos. Durante el embarazo, hay medicamentos que son seguros de tomar y otros que no.  Haga actividad fsica solo en la forma indicada por el mdico. Sentir clicos uterinos es un buen signo para detener la actividad fsica.  Contine comiendo alimentos que sanos con regularidad.  Use un sostn que le brinde buen soporte si le duelen las mamas.  No se d baos de inmersin en agua caliente, baos turcos ni saunas.  Colquese el cinturn de seguridad cuando conduzca.  No coma carne cruda ni queso sin cocinar; evite el contacto con las bandejas sanitarias de los gatos y la tierra que estos animales usan. Estos elementos contienen grmenes que pueden causar defectos congnitos en el beb.  Tome las vitaminas prenatales.  Si est estreida, pruebe un laxante suave (si el mdico lo autoriza). Consuma ms alimentos ricos en  fibra, como vegetales y frutas frescos y cereales integrales. Beba gran cantidad de lquido para mantener la orina de tono claro o color amarillo plido.  Dese baos de asiento con agua tibia para aliviar el dolor o las molestias causadas por las hemorroides. Use una crema para las hemorroides si el mdico la autoriza.  Si tiene venas varicosas, use medias de descanso. Eleve los pies durante 15minutos, 3 o 4veces por da. Limite la cantidad de sal en su dieta.  Evite levantar objetos pesados, use zapatos de tacones bajos y mantenga una buena postura.  Descanse con las piernas elevadas si tiene calambres o dolor de cintura.  Visite a su dentista si no lo ha hecho durante el embarazo. Use un cepillo de dientes blando para higienizarse los dientes y psese el hilo dental con suavidad.  Puede seguir manteniendo relaciones sexuales, a menos que el mdico le indique lo contrario.  No haga viajes largos excepto que sea absolutamente necesario y solo con la autorizacin del mdico.  Tome clases prenatales para entender, practicar y hacer preguntas sobre el trabajo de parto y el parto.  Haga un ensayo de la partida al hospital.  Prepare el bolso que   llevar al hospital.  Prepare la habitacin del beb.  Concurra a todas las visitas prenatales segn las indicaciones de su mdico. SOLICITE ATENCIN MDICA SI:  No est segura de que est en trabajo de parto o de que ha roto la bolsa de las aguas.  Tiene mareos.  Siente clicos leves, presin en la pelvis o dolor persistente en el abdomen.  Tiene nuseas, vmitos o diarrea persistentes.  Tiene secrecin vaginal con mal olor.  Siente dolor al orinar. SOLICITE ATENCIN MDICA DE INMEDIATO SI:   Tiene fiebre.  Tiene una prdida de lquido por la vagina.  Tiene sangrado o pequeas prdidas vaginales.  Siente dolor intenso o clicos en el abdomen.  Sube o baja de peso rpidamente.  Tiene dificultad para respirar y siente dolor de  pecho.  Sbitamente se le hinchan mucho el rostro, las manos, los tobillos, los pies o las piernas.  No ha sentido los movimientos del beb durante una hora.  Siente un dolor de cabeza intenso que no se alivia con medicamentos.  Hay cambios en la visin. Document Released: 09/20/2005 Document Revised: 12/16/2013 [employer] Patient Information 2015 [employer]. This information is not intended to replace advice given to you by your health care provider. Make sure you discuss any questions you have with your health care provider.  Lactancia materna (Breastfeeding) Decidir amamantar es una de las mejores elecciones que puede hacer por usted y su beb. El cambio hormonal durante el embarazo produce el desarrollo del tejido mamario y aumenta la cantidad y el tamao de los conductos galactforos. Estas hormonas tambin permiten que las protenas, los azcares y las grasas de la sangre produzcan la leche materna en las glndulas productoras de leche. Las hormonas impiden que la leche materna sea liberada antes del nacimiento del beb, adems de impulsar el flujo de leche luego del nacimiento. Una vez que ha comenzado a amamantar, pensar en el beb, as como la succin o el llanto, pueden estimular la liberacin de leche de las glndulas productoras de leche.  LOS BENEFICIOS DE AMAMANTAR Para el beb  La primera leche (calostro) ayuda a mejorar el funcionamiento del sistema digestivo del beb.  La leche tiene anticuerpos que ayudan a prevenir las infecciones en el beb.  El beb tiene una menor incidencia de asma, alergias y del sndrome de muerte sbita del lactante.  Los nutrientes en la leche materna son mejores para el beb que la leche maternizada y estn preparados exclusivamente para cubrir las necesidades del beb.  La leche materna mejora el desarrollo cerebral del beb.  Es menos probable que el beb desarrolle otras enfermedades, como obesidad infantil, asma o diabetes mellitus de  tipo 2. Para usted   La lactancia materna favorece el desarrollo de un vnculo muy especial entre la madre y el beb.  Es conveniente. La leche materna siempre est disponible a la temperatura correcta y es econmica.  La lactancia materna ayuda a quemar caloras y a perder el peso ganado durante el embarazo.  Favorece la contraccin del tero al tamao que tena antes del embarazo de manera ms rpida y disminuye el sangrado (loquios) despus del parto.  La lactancia materna contribuye a reducir el riesgo de desarrollar diabetes mellitus de tipo 2, osteoporosis o cncer de mama o de ovario en el futuro. SIGNOS DE QUE EL BEB EST HAMBRIENTO Primeros signos de hambre  Aumenta su estado de alerta o actividad.  Se estira.  Mueve la cabeza de un lado a otro.  Mueve la cabeza y   abre la boca cuando se le toca la mejilla o la comisura de la boca (reflejo de bsqueda).  Aumenta las vocalizaciones, tales como sonidos de succin, se relame los labios, emite arrullos, suspiros, o chirridos.  Mueve la mano hacia la boca.  Se chupa con ganas los dedos o las manos. Signos tardos de hambre  Est agitado.  Llora de manera intermitente. Signos de hambre extrema Los signos de hambre extrema requerirn que lo calme y lo consuele antes de que el beb pueda alimentarse adecuadamente. No espere a que se manifiesten los siguientes signos de hambre extrema para comenzar a amamantar:   Agitacin.  Llanto intenso y fuerte.   Gritos. INFORMACIN BSICA SOBRE LA LACTANCIA MATERNA Iniciacin de la lactancia materna  Encuentre un lugar cmodo para sentarse o acostarse, con un buen respaldo para el cuello y la espalda.  Coloque una almohada o una manta enrollada debajo del beb para acomodarlo a la altura de la mama (si est sentada). Las almohadas para amamantar se han diseado especialmente a fin de servir de apoyo para los brazos y el beb mientras amamanta.  Asegrese de que el abdomen del  beb est frente al suyo.  Masajee suavemente la mama. Con las yemas de los dedos, masajee la pared del pecho hacia el pezn en un movimiento circular. Esto estimula el flujo de leche. Es posible que deba continuar este movimiento mientras amamanta si la leche fluye lentamente.  Sostenga la mama con el pulgar por arriba del pezn y los otros 4 dedos por debajo de la mama. Asegrese de que los dedos se encuentren lejos del pezn y de la boca del beb.  Empuje suavemente los labios del beb con el pezn o con el dedo.  Cuando la boca del beb se abra lo suficiente, acrquelo rpidamente a la mama e introduzca todo el pezn y la zona oscura que lo rodea (areola), tanto como sea posible, dentro de la boca del beb.  Debe haber ms areola visible por arriba del labio superior del beb que por debajo del labio inferior.  La lengua del beb debe estar entre la enca inferior y la mama.  Asegrese de que la boca del beb est en la posicin correcta alrededor del pezn (prendida). Los labios del beb deben crear un sello sobre la mama y estar doblados hacia afuera (invertidos).  Es comn que el beb succione durante 2 a 3 minutos para que comience el flujo de leche materna. Cmo debe prenderse Es muy importante que le ensee al beb cmo prenderse adecuadamente a la mama. Si el beb no se prende adecuadamente, puede causarle dolor en el pezn y reducir la produccin de leche materna, y hacer que el beb tenga un escaso aumento de peso. Adems, si el beb no se prende adecuadamente al pezn, puede tragar aire durante la alimentacin. Esto puede causarle molestias al beb. Hacer eructar al beb al cambiar de mama puede ayudarlo a liberar el aire. Sin embargo, ensearle al beb cmo prenderse a la mama adecuadamente es la mejor manera de evitar que se sienta molesto por tragar aire mientras se alimenta. Signos de que el beb se ha prendido adecuadamente al pezn:   Tironea o succiona de modo silencioso,  sin causarle dolor.  Se escucha que traga cada 3 o 4 succiones.   Hay movimientos musculares por arriba y por delante de sus odos al succionar. Signos de que el beb no se ha prendido adecuadamente al pezn:   Hace ruidos de succin o   de chasquido mientras se alimenta.  Siente dolor en el pezn. Si cree que el beb no se prendi correctamente, deslice el dedo en la comisura de la boca y colquelo entre las encas del beb para interrumpir la succin. Intente comenzar a amamantar nuevamente. Signos de lactancia materna exitosa Signos del beb:   Disminuye gradualmente el nmero de succiones o cesa la succin por completo.  Se duerme.  Relaja el cuerpo.  Retiene una pequea cantidad de leche en la boca.  Se desprende solo del pecho. Signos que presenta usted:  Las mamas han aumentado la firmeza, el peso y el tamao 1 a 3 horas despus de amamantar.  Estn ms blandas inmediatamente despus de amamantar.  Un aumento del volumen de leche, y tambin un cambio en su consistencia y color se producen hacia el quinto da de lactancia materna.  Los pezones no duelen, ni estn agrietados ni sangran. Signos de que su beb recibe la cantidad de leche suficiente  Moja al menos 3 paales en 24 horas. La orina debe ser clara y de color amarillo plido a los 5 das de vida.  Defeca al menos 3 veces en 24 horas a los 5 das de vida. La materia fecal debe ser blanda y amarillenta.  Defeca al menos 3 veces en 24 horas a los 7 das de vida. La materia fecal debe ser grumosa y amarillenta.  No registra una prdida de peso mayor del 10% del peso al nacer durante los primeros 3 das de vida.  Aumenta de peso un promedio de 4 a 7onzas (113 a 198g) por semana despus de los 4 das de vida.  Aumenta de peso, diariamente, de manera uniforme a partir de los 5 das de vida, sin registrar prdida de peso despus de las 2semanas de vida. Despus de alimentarse, es posible que el beb regurgite una  pequea cantidad. Esto es frecuente. FRECUENCIA Y DURACIN DE LA LACTANCIA MATERNA El amamantamiento frecuente la ayudar a producir ms leche y a prevenir problemas de dolor en los pezones e hinchazn en las mamas. Alimente al beb cuando muestre signos de hambre o si siente la necesidad de reducir la congestin de las mamas. Esto se denomina "lactancia a demanda". Evite el uso del chupete mientras trabaja para establecer la lactancia (las primeras 4 a 6 semanas despus del nacimiento del beb). Despus de este perodo, podr ofrecerle un chupete. Las investigaciones demostraron que el uso del chupete durante el primer ao de vida del beb disminuye el riesgo de desarrollar el sndrome de muerte sbita del lactante (SMSL). Permita que el nio se alimente en cada mama todo lo que desee. Contine amamantando al beb hasta que haya terminado de alimentarse. Cuando el beb se desprende o se queda dormido mientras se est alimentando de la primera mama, ofrzcale la segunda. Debido a que, con frecuencia, los recin nacidos permanecen somnolientos las primeras semanas de vida, es posible que deba despertar al beb para alimentarlo. Los horarios de lactancia varan de un beb a otro. Sin embargo, las siguientes reglas pueden servir como gua para ayudarla a garantizar que el beb se alimenta adecuadamente:  Se puede amamantar a los recin nacidos (bebs de 4 semanas o menos de vida) cada 1 a 3 horas.  No deben transcurrir ms de 3 horas durante el da o 5 horas durante la noche sin que se amamante a los recin nacidos.  Debe amamantar al beb 8 veces como mnimo en un perodo de 24 horas, hasta que comience a   introducir slidos en su dieta, a los 6 meses de vida aproximadamente. EXTRACCIN DE LECHE MATERNA La extraccin y el almacenamiento de la leche materna le permiten asegurarse de que el beb se alimente exclusivamente de leche materna, aun en momentos en los que no puede amamantar. Esto tiene especial  importancia si debe regresar al trabajo en el perodo en que an est amamantando o si no puede estar presente en los momentos en que el beb debe alimentarse. Su asesor en lactancia puede orientarla sobre cunto tiempo es seguro almacenar leche materna.  El sacaleche es un aparato que le permite extraer leche de la mama a un recipiente estril. Luego, la leche materna extrada puede almacenarse en un refrigerador o congelador. Algunos sacaleches son manuales, mientras que otros son elctricos. Consulte a su asesor en lactancia qu tipo ser ms conveniente para usted. Los sacaleches se pueden comprar; sin embargo, algunos hospitales y grupos de apoyo a la lactancia materna alquilan sacaleches mensualmente. Un asesor en lactancia puede ensearle cmo extraer leche materna manualmente, en caso de que prefiera no usar un sacaleche.  CMO CUIDAR LAS MAMAS DURANTE LA LACTANCIA MATERNA Los pezones se secan, agrietan y duelen durante la lactancia materna. Las siguientes recomendaciones pueden ayudarla a mantener las mamas humectadas y sanas:  Evite usar jabn en los pezones.  Use un sostn de soporte. Aunque no son esenciales, las camisetas sin mangas o los sostenes especiales para amamantar estn diseados para acceder fcilmente a las mamas, para amamantar sin tener que quitarse todo el sostn o la camiseta. Evite usar sostenes con aro o sostenes muy ajustados.  Seque al aire sus pezones durante 3 a 4minutos despus de amamantar al beb.  Utilice solo apsitos de algodn en el sostn para absorber las prdidas de leche. La prdida de un poco de leche materna entre las tomas es normal.  Utilice lanolina sobre los pezones luego de amamantar. La lanolina ayuda a mantener la humedad normal de la piel. Si usa lanolina pura, no tiene que lavarse los pezones antes de volver a alimentar al beb. La lanolina pura no es txica para el beb. Adems, puede extraer manualmente algunas gotas de leche materna y masajear  suavemente esa leche sobre los pezones, para que la leche se seque al aire. Durante las primeras semanas despus de dar a [place], algunas mujeres pueden experimentar hinchazn en las mamas (congestin mamaria). La congestin puede hacer que sienta las mamas pesadas, calientes y sensibles al tacto. El pico de la congestin ocurre dentro de los 3 a 5 das despus del parto. Las siguientes recomendaciones pueden ayudarla a aliviar la congestin:  Vace por completo las mamas al amamantar o extraer leche. Puede aplicar calor hmedo en las mamas (en la ducha o con toallas hmedas para manos) antes de amamantar o extraer leche. Esto aumenta la circulacin y ayuda a que la leche fluya. Si el beb no vaca por completo las mamas cuando lo amamanta, extraiga la leche restante despus de que haya finalizado.  Use un sostn ajustado (para amamantar o comn) o una camiseta sin mangas durante 1 o 2 das para indicar al cuerpo que disminuya ligeramente la produccin de leche.  Aplique compresas de hielo sobre las mamas, a menos que le resulte demasiado incmodo.  Asegrese de que el beb est prendido y se encuentre en la posicin correcta mientras lo alimenta. Si la congestin persiste luego de 48 horas o despus de seguir estas recomendaciones, comunquese con su mdico o un asesor en lactancia. RECOMENDACIONES GENERALES   PARA EL CUIDADO DE LA SALUD DURANTE LA LACTANCIA MATERNA  Consuma alimentos saludables. Alterne comidas y colaciones, y coma 3 de cada una por da. Dado que lo que come afecta la leche materna, es posible que algunas comidas hagan que su beb se vuelva ms irritable de lo habitual. Evite comer este tipo de alimentos si percibe que afectan de manera negativa al beb.  Beba leche, jugos de fruta y agua para satisfacer su sed (aproximadamente 10 vasos al da).  Descanse con frecuencia, reljese y tome sus vitaminas prenatales para evitar la fatiga, el estrs y la anemia.  Contine con los  autocontroles de la mama.  Evite masticar y fumar tabaco.  Evite el consumo de alcohol y drogas. Algunos medicamentos, que pueden ser perjudiciales para el beb, pueden pasar a travs de la leche materna. Es importante que consulte a su mdico antes de tomar cualquier medicamento, incluidos todos los medicamentos recetados y de venta libre, as como los suplementos vitamnicos y herbales. Puede quedar embarazada durante la lactancia. Si desea controlar la natalidad, consulte a su mdico cules son las opciones ms seguras para el beb. SOLICITE ATENCIN MDICA SI:   Usted siente que quiere dejar de amamantar o se siente frustrada con la lactancia.  Siente dolor en las mamas o en los pezones.  Sus pezones estn agrietados o sangran.  Sus pechos estn irritados, sensibles o calientes.  Tiene un rea hinchada en cualquiera de las mamas.  Siente escalofros o fiebre.  Tiene nuseas o vmitos.  Presenta una secrecin de otro lquido distinto de la leche materna de los pezones.  Sus mamas no se llenan antes de amamantar al beb para el quinto da despus del parto.  Se siente triste y deprimida.  El beb est demasiado somnoliento como para comer bien.  El beb tiene problemas para dormir.  Moja menos de 3 paales en 24 horas.  Defeca menos de 3 veces en 24 horas.  La piel del beb o la parte blanca de los ojos se vuelven amarillentas.  El beb no ha aumentado de peso a los 5 das de vida. SOLICITE ATENCIN MDICA DE INMEDIATO SI:   El beb est muy cansado (letargo) y no se quiere despertar para comer.  Le sube la fiebre sin causa. Document Released: 12/11/2005 Document Revised: 12/16/2013 [employer] Patient Information 2015 [employer]. This information is not intended to replace advice given to you by your health care provider. Make sure you discuss any questions you have with your health care provider.  

## 2014-12-09 NOTE — Progress Notes (Signed)
   Subjective:    Desiree Marquez is a [id number] [redacted]w[redacted]d by LMP/27 week anatomy scan being seen today for her first obstetrical visit.  Her obstetrical history is significant for advanced maternal age. Patient does intend to breast feed. Pregnancy history fully reviewed.  Patient reports no bleeding, no contractions and no leaking.  Filed Vitals:   12/09/14 0856  BP: 124/73  Pulse: 72  Temp: 97.7 F (36.5 C)  Weight: 181 lb 3.2 oz (82.192 kg)    HISTORY: OB History  Gravida Para Term Preterm AB SAB TAB Ectopic Multiple Living  5 2 2  2 2    2     # Outcome Date GA Lbr Len/2nd Weight Sex Delivery Anes PTL Lv  5 Current           4 SAB           3 SAB           2 Term      Vag-Spont   Y  1 Term      Vag-Spont   Y     Past Medical History  Diagnosis Date  . Medical history non-contributory   . Headache    Past Surgical History  Procedure Laterality Date  . Appendectomy    . Cholecystectomy    . Cholecystectomy    . Appendectomy     Family History  Problem Relation Age of Onset  . Diabetes Mother   . Stroke Father   . Hearing loss Neg Hx      Exam    Uterus:   28 cm  Pelvic Exam:    Perineum: No Hemorrhoids, Normal Perineum   Vulva: Bartholin's, Urethra, Skene's normal   Vagina:  normal mucosa, normal discharge   pH: NA   Cervix: multiparous appearance and spotting after Pap   Adnexa: no mass, fullness, tenderness   Bony Pelvis: average and proven to 7+  System: Breast:  Declined   Skin: normal coloration and turgor, no rashes    Neurologic: normal mood, gait normal; reflexes normal and symmetric, grossly non-focal   Extremities: No Edema. Neg Homan's   HEENT sclera clear, anicteric and thyroid without masses   Mouth/Teeth dental hygiene good   Cardiovascular: regular rate and rhythm, no murmurs or gallops   Respiratory:  appears well, vitals normal, no respiratory distress, acyanotic, normal RR, chest clear, no wheezing, crepitations, rhonchi, normal  symmetric air entry   Abdomen: soft, non-tender; bowel sounds normal; no masses,  no organomegaly and well-healed scars from appendectomy and cholecytectomy    Urinary: urethral meatus normal      Assessment:    Pregnancy: [id number] Patient Active Problem List   Diagnosis Date Noted  . Supervision of normal pregnancy in third trimester 12/09/2014  . Screening, antenatal, for fetal anatomic survey         Plan:     Initial labs drawn. Pap Prenatal vitamins. Problem list reviewed and updated. Genetic Screening discussed Quad Screen: too late. Ultrasound discussed; fetal survey: results reviewed. Follow up in 2 weeks. 50% of 40 min visit spent on counseling and coordination of care.    Manya Silvas 12/09/2014

## 2014-12-10 LAB — PRENATAL PROFILE (SOLSTAS)
ANTIBODY SCREEN: NEGATIVE
BASOS ABS: 0.1 10*3/uL (ref 0.0–0.1)
BASOS PCT: 1 % (ref 0–1)
Eosinophils Absolute: 0.3 10*3/uL (ref 0.0–0.7)
Eosinophils Relative: 3 % (ref 0–5)
HCT: 33.6 % — ABNORMAL LOW (ref 36.0–46.0)
HIV: NONREACTIVE
Hemoglobin: 11.2 g/dL — ABNORMAL LOW (ref 12.0–15.0)
Hepatitis B Surface Ag: NEGATIVE
Lymphocytes Relative: 17 % (ref 12–46)
Lymphs Abs: 1.7 10*3/uL (ref 0.7–4.0)
MCH: 26.4 pg (ref 26.0–34.0)
MCHC: 33.3 g/dL (ref 30.0–36.0)
MCV: 79.2 fL (ref 78.0–100.0)
MPV: 9.6 fL (ref 9.4–12.4)
Monocytes Absolute: 0.4 10*3/uL (ref 0.1–1.0)
Monocytes Relative: 4 % (ref 3–12)
NEUTROS ABS: 7.4 10*3/uL (ref 1.7–7.7)
Neutrophils Relative %: 75 % (ref 43–77)
PLATELETS: 262 10*3/uL (ref 150–400)
RBC: 4.24 MIL/uL (ref 3.87–5.11)
RDW: 14.1 % (ref 11.5–15.5)
Rh Type: POSITIVE
Rubella: 10.1 Index — ABNORMAL HIGH (ref <0.90)
WBC: 9.8 10*3/uL (ref 4.0–10.5)

## 2014-12-10 LAB — PRESCRIPTION MONITORING PROFILE (19 PANEL)
Amphetamine/Meth: NEGATIVE ng/mL
BENZODIAZEPINE SCREEN, URINE: NEGATIVE ng/mL
BUPRENORPHINE, URINE: NEGATIVE ng/mL
Barbiturate Screen, Urine: NEGATIVE ng/mL
CANNABINOID SCRN UR: NEGATIVE ng/mL
Carisoprodol, Urine: NEGATIVE ng/mL
Cocaine Metabolites: NEGATIVE ng/mL
Creatinine, Urine: 116.29 mg/dL (ref >20.0)
Fentanyl, Ur: NEGATIVE ng/mL
MDMA URINE: NEGATIVE ng/mL
Meperidine, Ur: NEGATIVE ng/mL
Methadone Screen, Urine: NEGATIVE ng/mL
Methaqualone: NEGATIVE ng/mL
Nitrites, Initial: NEGATIVE ug/mL
OPIATE SCREEN, URINE: NEGATIVE ng/mL
Oxycodone Screen, Ur: NEGATIVE ng/mL
PHENCYCLIDINE, UR: NEGATIVE ng/mL
PROPOXYPHENE: NEGATIVE ng/mL
TAPENTADOLUR: NEGATIVE ng/mL
Tramadol Scrn, Ur: NEGATIVE ng/mL
ZOLPIDEM, URINE: NEGATIVE ng/mL
pH, Initial: 7.1 pH (ref 4.5–8.9)

## 2014-12-10 LAB — GLUCOSE TOLERANCE, 1 HOUR (50G) W/O FASTING: Glucose, 1 Hour GTT: 100 mg/dL (ref 70–140)

## 2014-12-11 ENCOUNTER — Encounter: Payer: Self-pay | Admitting: *Deleted

## 2014-12-11 LAB — HEMOGLOBINOPATHY EVALUATION
HGB F QUANT: 0 % (ref 0.0–2.0)
Hemoglobin Other: 0 %
Hgb A2 Quant: 2.5 % (ref 2.2–3.2)
Hgb A: 97.5 % (ref 96.8–97.8)
Hgb S Quant: 0 %

## 2014-12-11 LAB — CULTURE, OB URINE
Colony Count: NO GROWTH
ORGANISM ID, BACTERIA: NO GROWTH

## 2014-12-16 ENCOUNTER — Ambulatory Visit: Payer: Self-pay

## 2014-12-17 LAB — CYTOLOGY - PAP

## 2014-12-23 ENCOUNTER — Ambulatory Visit (INDEPENDENT_AMBULATORY_CARE_PROVIDER_SITE_OTHER): Payer: Self-pay | Admitting: Advanced Practice Midwife

## 2014-12-23 VITALS — BP 108/62 | HR 72 | Temp 97.4°F | Wt 182.6 lb

## 2014-12-23 DIAGNOSIS — Z3493 Encounter for supervision of normal pregnancy, unspecified, third trimester: Secondary | ICD-10-CM

## 2014-12-23 DIAGNOSIS — Z0489 Encounter for examination and observation for other specified reasons: Secondary | ICD-10-CM

## 2014-12-23 DIAGNOSIS — Z3483 Encounter for supervision of other normal pregnancy, third trimester: Secondary | ICD-10-CM

## 2014-12-23 DIAGNOSIS — IMO0002 Reserved for concepts with insufficient information to code with codable children: Secondary | ICD-10-CM

## 2014-12-23 LAB — POCT URINALYSIS DIP (DEVICE)
BILIRUBIN URINE: NEGATIVE
Glucose, UA: NEGATIVE mg/dL
Hgb urine dipstick: NEGATIVE
KETONES UR: NEGATIVE mg/dL
Nitrite: NEGATIVE
Protein, ur: NEGATIVE mg/dL
Specific Gravity, Urine: 1.02 (ref 1.005–1.030)
Urobilinogen, UA: 0.2 mg/dL (ref 0.0–1.0)
pH: 6.5 (ref 5.0–8.0)

## 2014-12-23 NOTE — Progress Notes (Signed)
C/o of occasional hip pain.

## 2014-12-23 NOTE — Patient Instructions (Signed)
Informacin sobre Nurse, learning disability  (Preterm Labor Information) El parto prematuro comienza antes de la semana 61 de [place]. La duracin de un embarazo normal es de 39 a 41 semanas.  CAUSAS  Generalmente no hay una causa que pueda identificarse del motivo por el que una mujer comienza un trabajo de parto prematuro. Sin embargo, una de las causas conocidas ms frecuentes son las infecciones. Las infecciones del tero, el cuello, la vagina, el lquido [place], la vejiga, los riones y Nurse, children's de los pulmones (neumona) pueden hacer que el trabajo de parto se inicie. Otras causas que pueden sospecharse son:   Infecciones urogenitales, como infecciones por hongos y vaginosis bacteriana.   Anormalidades uterinas (forma del tero, sptum uterino, fibromas, hemorragias en la placenta).   Un cuello que ha sido operado (puede ser que no permanezca cerrado).   Malformaciones del feto.   Gestaciones mltiples (mellizos, trillizos y ms).   Ruptura del saco [place]  1. Historia previa de Desiree Marquez.  2. Tener ruptura prematura de las membranas (RPM).  3. La placenta cubre la abertura del cuello (placenta previa).  4. La placenta se separa del tero (abrupcin placentaria).  5. El cuello es demasiado dbil para contener al beb en el tero (cuello incompetente).  6. Hay mucho lquido en el saco amnitico (polihidramnios).  7. Consumo de drogas o hbito de fumar durante el embarazo.  8. No aumentar de peso lo suficiente durante el embarazo.  9. Mujeres menores de 332 Desiree Marquez o mayores de 35 aos.  10. Nivel socioeconmico bajo.  11. Pertenecer a la Mining engineer. SNTOMAS  Los signos y sntomas del trabajo de parto prematuro son:   Desiree Marquez similares a los Agricultural engineer, dolor abdominal o dolor de espalda.  Contracciones uterinas regulares, tan frecuentes como seis por hora, sin importar su intensidad (pueden ser suaves o dolorosas).  Contracciones  que comienzan en la parte superior del tero y se expanden hacia abajo, a la zona inferior del abdomen y la espalda.   Sensacin de aumento de presin en la pelvis.   Aparece una secrecin acuosa o sanguinolenta por la vagina.  TRATAMIENTO  Segn el tiempo del embarazo y otras Desiree Marquez, el mdico puede indicar reposo en cama. Si es necesario, le indicarn medicamentos para Scientist, water quality las contracciones y para Neurosurgeon los pulmones del feto. Si el trabajo de parto se inicia antes de las 34 semanas de Desiree Marquez, se recomienda la hospitalizacin. El tratamiento depende de las condiciones en que se encuentren usted y el feto.  QU DEBE HACER SI PIENSA QUE EST EN TRABAJO DE PARTO PREMATURO?  Comunquese con su mdico inmediatamente. Debe concurrir al hospital para ser controlada inmediatamente.  Moosic EN FUTUROS EMBARAZOS?  Usted debe:   Si fuma, abandonar el hbito.  Mantener un peso saludable y evitar sustancias qumicas y drogas innecesarias.  Controlar todo tipo de infeccin.  Informe a su mdico si tiene una historia conocida de trabajo de parto prematuro. Document Released: 03/19/2008 Document Revised: 08/13/2013 [facility] Patient Information 2015 [place]. This information is not intended to replace advice given to you by your health care provider. Make sure you discuss any questions you have with your health care provider.  Evaluacin de los movimientos fetales  (Fetal Movement Counts) Nombre del paciente: __________________________________________________ Desiree Marquez estimada: ____________________ Desiree Marquez de los movimientos fetales es muy recomendable en los embarazos de alto riesgo, pero tambin es una buena idea que lo hagan todas  las embarazadas. El Buyer, retail que comience a contarlos a las 28 semanas de [place]. Los movimientos fetales suelen aumentar:   Despus de Music therapist.  Despus de la  actividad fsica.  Despus de comer o beber [employer] o fro.  En reposo. Preste atencin cuando sienta que el beb est ms activo. Esto le ayudar a notar un patrn de ciclos de vigilia y sueo de su beb y cules son los factores que contribuyen a un aumento de los movimientos fetales. Es importante llevar a cabo un recuento de movimientos fetales, al mismo tiempo cada da, cuando el beb normalmente est ms activo.  Empire 12. Busque un lugar tranquilo y cmodo para sentarse o recostarse sobre el lado izquierdo. Al recostarse sobre su lado izquierdo, le proporciona una mejor circulacin de [place] y oxgeno al beb. 11. Anote el da y la hora en una hoja de papel o en un diario. [place] pataditas, revoloteos, chasquidos, vueltas o pinchazos en un perodo de 2 horas. Debe sentir al menos 10 movimientos en 2 horas. 15. Si no siente 10 movimientos en 2 horas, espere 2  3 horas y cuente de nuevo. Busque cambios en el patrn o si no cuenta lo suficiente en 2 horas. SOLICITE ATENCIN MDICA SI:   Siente menos de 10 pataditas en 2 horas, en dos intentos.  No hay movimientos durante una hora.  El patrn se modifica o le lleva ms Guy contar las 10 pataditas.  Siente que el beb no se mueve como lo hace habitualmente. Fecha: ____________ Movimientos: ____________ Desiree Marquez inicio: ____________ Desiree Marquez finalizacin: ____________  [name]: ____________ Movimientos: ____________ Desiree Marquez inicio: ____________ Desiree Marquez finalizacin: ____________  [name]: ____________ Movimientos: ____________ Desiree Marquez inicio: ____________ Desiree Marquez finalizacin: ____________  [name]: ____________ Movimientos: ____________ Desiree Marquez inicio: ____________ Desiree Marquez finalizacin: ____________  [name]: ____________ Movimientos: ____________ Desiree Marquez inicio: ____________ Desiree Marquez de finalizacin: ____________  [name]: ____________ Movimientos: ____________ Desiree Marquez de inicio: ____________  Desiree Marquez de finalizacin: ____________  [name]: ____________ Movimientos: ____________ Desiree Marquez de inicio: ____________ Desiree Marquez de finalizacin: ____________  [name]: ____________ Movimientos: ____________ Desiree Marquez de inicio: ____________ Desiree Marquez de finalizacin: ____________  [name]: ____________ Movimientos: ____________ Desiree Marquez de inicio: ____________ Desiree Marquez de finalizacin: ____________  [name]: ____________ Movimientos: ____________ Desiree Marquez de inicio: ____________ Desiree Marquez de finalizacin: ____________  [name]: ____________ Movimientos: ____________ Desiree Marquez de inicio: ____________ Desiree Marquez de finalizacin: ____________  [name]: ____________ Movimientos: ____________ Desiree Marquez de inicio: ____________ Desiree Marquez de finalizacin: ____________  [name]: ____________ Movimientos: ____________ Desiree Marquez de inicio: ____________ Desiree Marquez de finalizacin: ____________  [name]: ____________ Movimientos: ____________ Desiree Marquez de inicio: ____________ Desiree Marquez de finalizacin: ____________  [name]: ____________ Movimientos: ____________ Desiree Marquez de inicio: ____________ Desiree Marquez de finalizacin: ____________  [name]: ____________ Movimientos: ____________ Desiree Marquez de inicio: ____________ Desiree Marquez de finalizacin: ____________  [name]: ____________ Movimientos: ____________ Desiree Marquez de inicio: ____________ Desiree Marquez de finalizacin: ____________  [name]: ____________ Movimientos: ____________ Desiree Marquez de inicio: ____________ Desiree Marquez de finalizacin: ____________  [name]: ____________ Movimientos: ____________ Desiree Marquez de inicio: ____________ Desiree Marquez de finalizacin: ____________  [name]: ____________ Movimientos: ____________ Desiree Marquez de inicio: ____________ Desiree Marquez de finalizacin: ____________  [name]: ____________ Movimientos: ____________ Desiree Marquez de inicio: ____________ Desiree Marquez de finalizacin: ____________  [name]: ____________ Movimientos: ____________ Desiree Marquez de inicio: ____________ Desiree Marquez de finalizacin: ____________  [name]: ____________ Movimientos: ____________ Desiree Marquez de inicio: ____________ Desiree Marquez de finalizacin: ____________  [name]:  ____________ Movimientos: ____________ Desiree Marquez inicio: ____________ Desiree Marquez finalizacin: ____________  [name]: ____________ Movimientos: ____________ Desiree Marquez de inicio: ____________ Desiree Marquez de finalizacin: ____________  Fecha: ____________ Movimientos: ____________ Desiree Marquez inicio: ____________ Desiree Marquez finalizacin: ____________  [name]: ____________ Movimientos: ____________ Desiree Marquez inicio: ____________ Desiree Marquez finalizacin: ____________  [name]: ____________ Movimientos: ____________ Desiree Marquez inicio: ____________ Desiree Marquez finalizacin: ____________  [name]: ____________ Movimientos: ____________ Desiree Marquez inicio: ____________ Desiree Marquez finalizacin: ____________  [name]: ____________ Movimientos: ____________ Desiree Marquez inicio: ____________ Desiree Marquez finalizacin: ____________  [name]: ____________ Movimientos: ____________ Desiree Marquez inicio: ____________ Desiree Marquez de finalizacin: ____________  [name]: ____________ Movimientos: ____________ Desiree Marquez de inicio: ____________ Desiree Marquez de finalizacin: ____________  [name]: ____________ Movimientos: ____________ Desiree Marquez de inicio: ____________ Desiree Marquez de finalizacin: ____________  [name]: ____________ Movimientos: ____________ Desiree Marquez de inicio: ____________ Desiree Marquez de finalizacin: ____________  [name]: ____________ Movimientos: ____________ Desiree Marquez de inicio: ____________ Desiree Marquez de finalizacin: ____________  [name]: ____________ Movimientos: ____________ Desiree Marquez de inicio: ____________ Desiree Marquez de finalizacin: ____________  [name]: ____________ Movimientos: ____________ Desiree Marquez de inicio: ____________ Desiree Marquez de finalizacin: ____________  [name]: ____________ Movimientos: ____________ Desiree Marquez de inicio: ____________ Desiree Marquez de finalizacin: ____________  [name]: ____________ Movimientos: ____________ Desiree Marquez de inicio: ____________ Desiree Marquez de finalizacin: ____________  [name]: ____________ Movimientos: ____________ Desiree Marquez de inicio: ____________ Desiree Marquez de finalizacin: ____________  [name]: ____________ Movimientos: ____________ Desiree Marquez  de inicio: ____________ Desiree Marquez de finalizacin: ____________  [name]: ____________ Movimientos: ____________ Desiree Marquez de inicio: ____________ Desiree Marquez de finalizacin: ____________  [name]: ____________ Movimientos: ____________ Desiree Marquez de inicio: ____________ Desiree Marquez de finalizacin: ____________  [name]: ____________ Movimientos: ____________ Desiree Marquez de inicio: ____________ Desiree Marquez de finalizacin: ____________  [name]: ____________ Movimientos: ____________ Desiree Marquez de inicio: ____________ Desiree Marquez de finalizacin: ____________  [name]: ____________ Movimientos: ____________ Desiree Marquez de inicio: ____________ Desiree Marquez de finalizacin: ____________  [name]: ____________ Movimientos: ____________ Desiree Marquez de inicio: ____________ Desiree Marquez de finalizacin: ____________  [name]: ____________ Movimientos: ____________ Desiree Marquez de inicio: ____________ Desiree Marquez de finalizacin: ____________  [name]: ____________ Movimientos: ____________ Desiree Marquez de inicio: ____________ Desiree Marquez de finalizacin: ____________  [name]: ____________ Movimientos: ____________ Desiree Marquez de inicio: ____________ Desiree Marquez de finalizacin: ____________  [name]: ____________ Movimientos: ____________ Desiree Marquez de inicio: ____________ Desiree Marquez de finalizacin: ____________  [name]: ____________ Movimientos: ____________ Desiree Marquez de inicio: ____________ Desiree Marquez de finalizacin: ____________  [name]: ____________ Movimientos: ____________ Desiree Marquez de inicio: ____________ Desiree Marquez de finalizacin: ____________  [name]: ____________ Movimientos: ____________ Desiree Marquez de inicio: ____________ Desiree Marquez de finalizacin: ____________  [name]: ____________ Movimientos: ____________ Desiree Marquez de inicio: ____________ Desiree Marquez de finalizacin: ____________  [name]: ____________ Movimientos: ____________ Desiree Marquez de inicio: ____________ Desiree Marquez de finalizacin: ____________  Document Released: 03/19/2008 Document Revised: 11/27/2012 Exit[employer] Patient Information 2015 [place], [place]. This information is not intended to replace advice given to you by your health care provider.  Make sure you discuss any questions you have with your health care provider.

## 2014-12-23 NOTE — Progress Notes (Signed)
F/U US for incomplete anatomy scheduled. No concerns.

## 2015-01-06 ENCOUNTER — Inpatient Hospital Stay (HOSPITAL_COMMUNITY): Payer: Self-pay

## 2015-01-06 ENCOUNTER — Ambulatory Visit (INDEPENDENT_AMBULATORY_CARE_PROVIDER_SITE_OTHER): Payer: Self-pay | Admitting: Family

## 2015-01-06 ENCOUNTER — Inpatient Hospital Stay (HOSPITAL_COMMUNITY)
Admission: AD | Admit: 2015-01-06 | Discharge: 2015-01-06 | Disposition: A | Discharge status: Home / Self Care | Payer: Self-pay | Source: Ambulatory Visit | Attending: Obstetrics and Gynecology | Admitting: Obstetrics and Gynecology

## 2015-01-06 ENCOUNTER — Encounter (HOSPITAL_COMMUNITY): Payer: Self-pay | Admitting: *Deleted

## 2015-01-06 VITALS — BP 115/67 | HR 65 | Temp 97.4°F | Wt 187.0 lb

## 2015-01-06 DIAGNOSIS — Z3493 Encounter for supervision of normal pregnancy, unspecified, third trimester: Secondary | ICD-10-CM

## 2015-01-06 DIAGNOSIS — O4693 Antepartum hemorrhage, unspecified, third trimester: Secondary | ICD-10-CM | POA: Insufficient documentation

## 2015-01-06 DIAGNOSIS — Z364 Encounter for antenatal screening for fetal growth retardation: Secondary | ICD-10-CM | POA: Insufficient documentation

## 2015-01-06 DIAGNOSIS — O468X3 Other antepartum hemorrhage, third trimester: Secondary | ICD-10-CM

## 2015-01-06 DIAGNOSIS — Z3483 Encounter for supervision of other normal pregnancy, third trimester: Secondary | ICD-10-CM

## 2015-01-06 DIAGNOSIS — Z3A32 32 weeks gestation of pregnancy: Secondary | ICD-10-CM | POA: Insufficient documentation

## 2015-01-06 DIAGNOSIS — O4690 Antepartum hemorrhage, unspecified, unspecified trimester: Secondary | ICD-10-CM | POA: Insufficient documentation

## 2015-01-06 DIAGNOSIS — R58 Hemorrhage, not elsewhere classified: Secondary | ICD-10-CM

## 2015-01-06 LAB — POCT URINALYSIS DIP (DEVICE)
GLUCOSE, UA: NEGATIVE mg/dL
KETONES UR: NEGATIVE mg/dL
Nitrite: NEGATIVE
Protein, ur: 30 mg/dL — AB
SPECIFIC GRAVITY, URINE: 1.025 (ref 1.005–1.030)
Urobilinogen, UA: 0.2 mg/dL (ref 0.0–1.0)
pH: 7 (ref 5.0–8.0)

## 2015-01-06 LAB — URINE MICROSCOPIC-ADD ON

## 2015-01-06 LAB — WET PREP, GENITAL
CLUE CELLS WET PREP: NONE SEEN
Trich, Wet Prep: NONE SEEN
YEAST WET PREP: NONE SEEN

## 2015-01-06 LAB — URINALYSIS, ROUTINE W REFLEX MICROSCOPIC
BILIRUBIN URINE: NEGATIVE
Glucose, UA: NEGATIVE mg/dL
Ketones, ur: NEGATIVE mg/dL
NITRITE: NEGATIVE
Protein, ur: 30 mg/dL — AB
SPECIFIC GRAVITY, URINE: 1.025 (ref 1.005–1.030)
UROBILINOGEN UA: 0.2 mg/dL (ref 0.0–1.0)
pH: 6 (ref 5.0–8.0)

## 2015-01-06 MED ORDER — ACETAMINOPHEN 325 MG PO TABS: 650.0000 mg | ORAL_TABLET | Freq: Once | ORAL | Status: DC

## 2015-01-06 NOTE — Progress Notes (Signed)
Raquel Mora used as interpreter for this encounter.

## 2015-01-06 NOTE — MAU Note (Signed)
C/o vaginal bleeding that has progressively gotten heavier since after her OB appointment today @ [phone];

## 2015-01-06 NOTE — MAU Provider Note (Signed)
History     CSN: [id number]  Arrival date and time: 01/06/15 1444   First Provider Initiated Contact with Patient 01/06/15 1539      No chief complaint on file.  HPI Comments: Desiree Marquez 79 [id number]  [redacted]w[redacted]d presents to [facility] with vaginal bleeding after office visit today after 930 am. Has not had sex, no pelvic exam today. Denies contractions, LOF. Reports good fetal movement.         Past Medical History  Diagnosis Date  . Medical history non-contributory   . Headache     Past Surgical History  Procedure Laterality Date  . Appendectomy    . Cholecystectomy    . Cholecystectomy    . Appendectomy      Family History  Problem Relation Age of Onset  . Diabetes Mother   . Stroke Father   . Hearing loss Neg Hx     History  Substance Use Topics  . Smoking status: Never Smoker   . Smokeless tobacco: Never Used  . Alcohol Use: No    Allergies: No Known Allergies  Prescriptions prior to admission  Medication Sig Dispense Refill Last Dose  . fluconazole (DIFLUCAN) 150 MG tablet Take one tab today and one tab in three days. (Patient not taking: Reported on 12/09/2014) 2 tablet 0 Not Taking  . fluconazole (DIFLUCAN) 150 MG tablet Take 1 tablet now and 1 tablet in 3 days.  Tome 1 tableta ahora y 1 comprimido en 3 das. (Patient not taking: Reported on 12/09/2014) 2 tablet 0 Not Taking  . Prenatal Vit-Fe Fumarate-FA (PRENATAL MULTIVITAMIN) TABS Take 1 tablet by mouth daily at 12 noon.   Taking    Review of Systems  Constitutional: Negative.   HENT: Negative.   Eyes: Negative.   Respiratory: Negative.   Cardiovascular: Negative.   Gastrointestinal: Negative.   Genitourinary:       Vaginal bleeding  Musculoskeletal: Negative.   Skin: Negative.   Neurological: Negative.   Endo/Heme/Allergies: Negative.   Psychiatric/Behavioral: Negative.    Physical Exam   Blood pressure 133/80, pulse 80, temperature 97.2 F (36.2 C), temperature source Oral, resp.  rate 16, last menstrual period 05/26/2014, unknown if currently breastfeeding.  Physical Exam  Constitutional: She is oriented to person, place, and time. She appears well-developed and well-nourished.  HENT:  Head: Normocephalic and atraumatic.  Genitourinary:  Genital:no lesions Vaginal:dark brown bleeding Cervix: no active bleeding; friable; external os 1-2/ internal os closed   Musculoskeletal: She exhibits no edema or tenderness.  Neurological: She is alert and oriented to person, place, and time.  Skin: Skin is warm and dry.  Psychiatric: She has a normal mood and affect.   Results for orders placed or performed during the hospital encounter of 01/06/15 (from the past 24 hour(s))  Urinalysis, Routine w reflex microscopic     Status: Abnormal   Collection Time: 01/06/15  2:54 PM  Result Value Ref Range   Color, Urine YELLOW YELLOW   APPearance CLEAR CLEAR   Specific Gravity, Urine 1.025 1.005 - 1.030   pH 6.0 5.0 - 8.0   Glucose, UA NEGATIVE NEGATIVE mg/dL   Hgb urine dipstick LARGE (A) NEGATIVE   Bilirubin Urine NEGATIVE NEGATIVE   Ketones, ur NEGATIVE NEGATIVE mg/dL   Protein, ur 30 (A) NEGATIVE mg/dL   Urobilinogen, UA 0.2 0.0 - 1.0 mg/dL   Nitrite NEGATIVE NEGATIVE   Leukocytes, UA TRACE (A) NEGATIVE  Urine microscopic-add on     Status: Abnormal   Collection Time:  01/06/15  2:54 PM  Result Value Ref Range   Squamous Epithelial / LPF MANY (A) RARE   WBC, UA 11-20 <3 WBC/hpf   RBC / HPF 11-20 <3 RBC/hpf   Bacteria, UA MANY (A) RARE   Urine-Other MUCOUS PRESENT   Wet prep, genital     Status: Abnormal   Collection Time: 01/06/15  3:55 PM  Result Value Ref Range   Yeast Wet Prep HPF POC NONE SEEN NONE SEEN   Trich, Wet Prep NONE SEEN NONE SEEN   Clue Cells Wet Prep HPF POC NONE SEEN NONE SEEN   WBC, Wet Prep HPF POC FEW (A) NONE SEEN   US Ob Follow Up  01/06/2015   OBSTETRICAL ULTRASOUND: This exam was performed within a [facility]. The OB  US report was generated in the AS system, and faxed to the ordering physician.   This report is available in the [facility]. See the AS Obstetric US report via the Image Link.   Ultrasound report shows no previa or abruption. AFV normal  TOCO: Rate 140 , reassuring, no contraction  MAU Course  Procedures  MDM Spanish Interpreter at Bedside Tylenol for mild discomfort Ultrasound greater than 14 weeks Wet prep GC Chlamydia Urine culture Assessment and Plan   A: Vaginal bleeding in third trimester/ likely friable cervix  P: Above orders Continue pelvic rest Follow up in clinic if bleeding continues Return to MAU as needed  Georgia Duff 01/06/2015, 3:57 PM

## 2015-01-06 NOTE — Discharge Instructions (Signed)
Hemorragia vaginal durante el embarazo (tercer trimestre) (Vaginal Bleeding During Pregnancy, Third Trimester) Durante el embarazo es relativamente frecuente que se presente una pequea hemorragia (manchas). Hay diversos factores que pueden causar hemorragia o Geologist, engineering. A veces, la hemorragia es normal y no es un problema. No obstante, si esto sucede durante el tercer trimestre, puede ser un signo de afeccin grave de la madre y el beb. Debe informar a su mdico de inmediato si tiene alguna hemorragia vaginal.  Algunas causas posibles de hemorragia vaginal durante el tercer trimestre incluyen:   La placenta puede estar cubriendo la apertura del tero de [place] total o parcial (placenta previa).  La placenta puede haberse separado del tero (abrupcin placentaria).  Puede haber infeccin o una masa en el cuello del tero.  Puede ser que se est iniciando el trabajo de parto, entonces se denomina "prdida del tapn mucoso".  La placenta puede desarrollarse en la capa muscular del tero (placenta acreta). INSTRUCCIONES PARA EL CUIDADO EN EL HOGAR  Controle su afeccin para ver si hay cambios. Las siguientes indicaciones ayudarn a Writer [employer] pueda sentir:   Siga las indicaciones del mdico para restringir su actividad. Si el mdico le indica descanso en la cama, debe quedarse en la cama y levantarse solo para ir al bao. No obstante, el mdico puede permitirle que contine realizando tareas livianas.  Si es necesario, organcese para que alguien le ayude con las actividades y responsabilidades cotidianas mientras est en cama.  Lleve un registro de la cantidad y Barista de saturacin de las toallas higinicas que Medical laboratory scientific officer. Anote este dato.  No use tampones.No se haga duchas vaginales.  No tenga relaciones sexuales u orgasmos hasta que el mdico la autorice.  Siga los consejos del mdico acerca de levantar objetos pesados, conducir y Optometrist  actividad fsica.  Si elimina tejido por la vagina, gurdelo para mostrrselo al [employer].  Elma solo medicamentos de venta libre o recetados, segn las indicaciones del mdico.  No  tome aspirina, ya que puede causar hemorragias.  Cumpla con todas las visitas de control, segn le indique su mdico. SOLICITE ATENCIN MDICA SI:  Tiene un sangrado vaginal en cualquier momento del embarazo.  Tiene calambres o dolores de [place].  Tiene fiebre y no puede bajarla con medicamentos. SOLICITE ATENCIN MDICA DE INMEDIATO SI:   Siente calambres intensos o dolor en la espalda o en el vientre (abdomen).  Tiene escalofros.  Tiene una prdida de lquido por la vagina.  Elimina cogulos grandes o tejido por la vagina.  La hemorragia aumenta.  Se siente mareada o dbil.  Se desmaya.  Siente que el beb se mueve menos o no se mueve en absoluto. ASEGRESE DE QUE:  Comprende estas instrucciones.  Controlar su afeccin.  Recibir ayuda de inmediato si no mejora o si empeora. Document Released: 09/20/2005 Document Revised: 12/16/2013 [facility] Patient Information 2015 [place]. This information is not intended to replace advice given to you by your health care provider. Make sure you discuss any questions you have with your health care provider.

## 2015-01-06 NOTE — Progress Notes (Signed)
Discussed poor visualization of anatomy with first ultrasound > pt declines second if not required.  [employer] as interpreter.

## 2015-01-07 LAB — CULTURE, OB URINE
CULTURE: NO GROWTH
Colony Count: NO GROWTH
Special Requests: NORMAL

## 2015-01-07 LAB — GC/CHLAMYDIA PROBE AMP (~~LOC~~) NOT AT ARMC
Chlamydia: NEGATIVE
Neisseria Gonorrhea: NEGATIVE

## 2015-01-16 IMAGING — US US OB TRANSVAGINAL
1 series · 14 of 28 positions shown · non-contrast
Comparison: 03/28/2013

CLINICAL DATA: Pregnant, bleeding.  Unable to detect fetal heart
tones on Doppler. Gravida 3, para 2.

OBSTETRIC <14 WK US AND TRANSVAGINAL OB US
TECHNIQUE: Both transabdominal and transvaginal ultrasound
examinations were performed for complete evaluation of the
gestation as well as the maternal uterus, adnexal regions, and
pelvic cul-de-sac.  Transvaginal technique was performed to assess
early pregnancy.

[Series 1: us ob comp less 14 wks · 35 acquisitions, 14 frames shown]
[im 2/35]
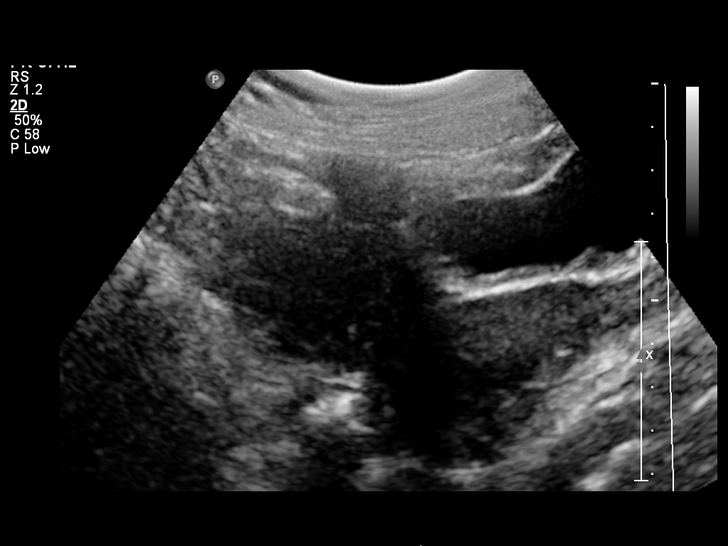
[im 4/35]
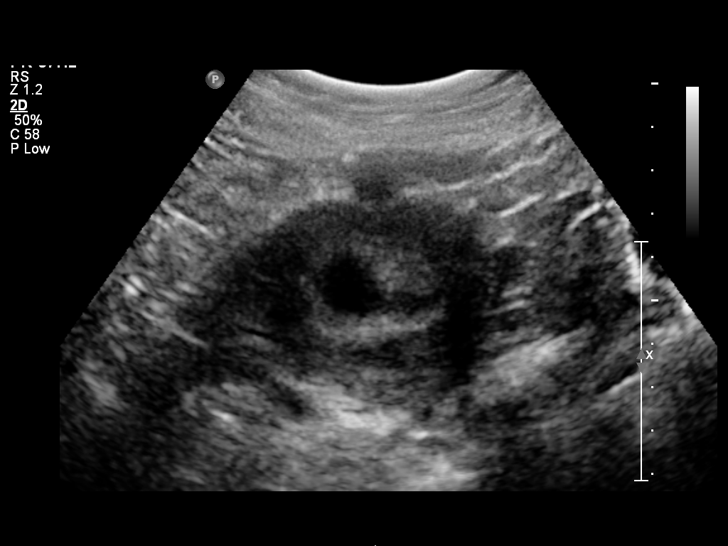
[im 7/35]
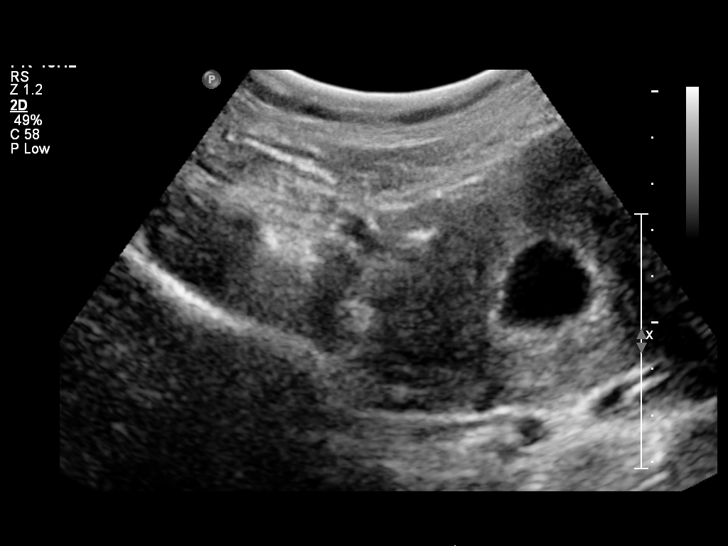
[im 9/35]
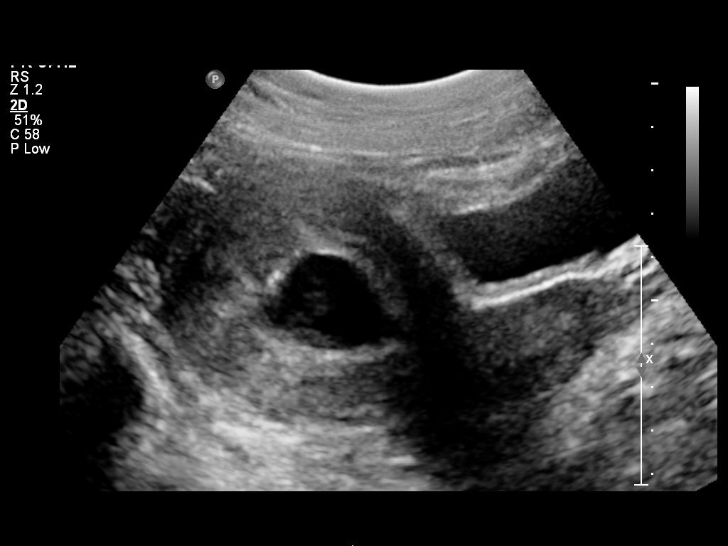
[im 12/35]
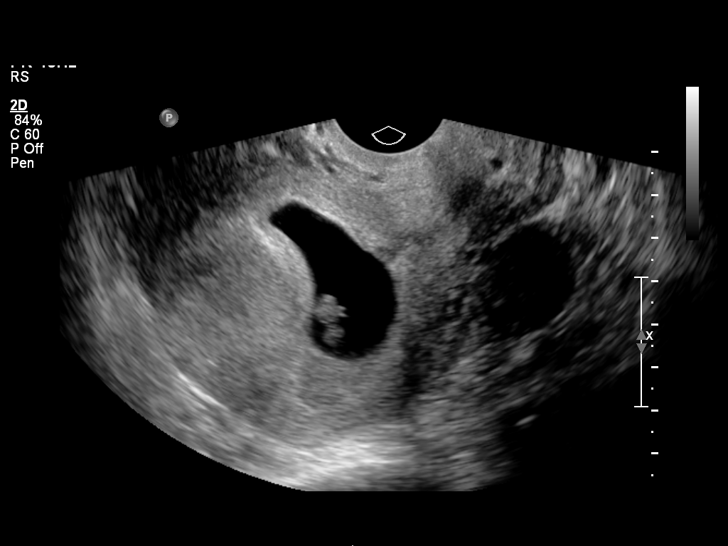
[im 14/35]
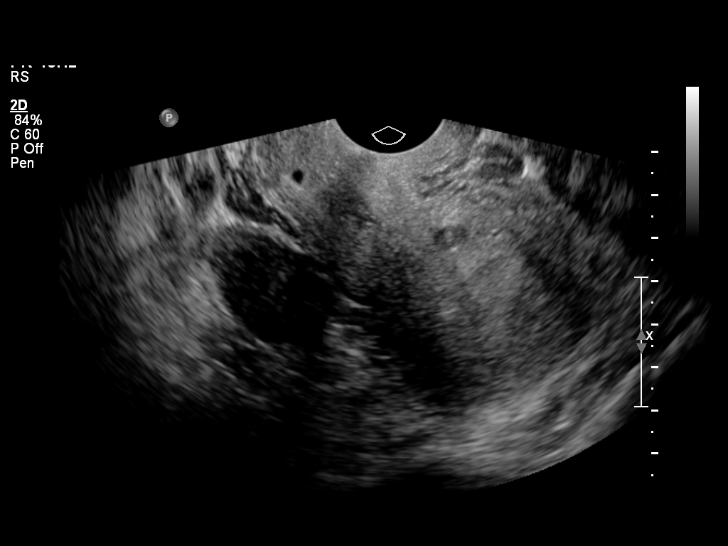
[im 17/35]
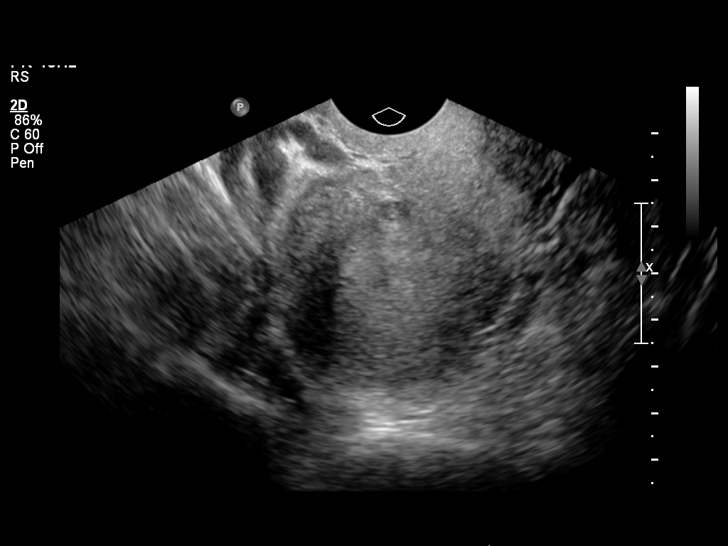
[im 19/35]
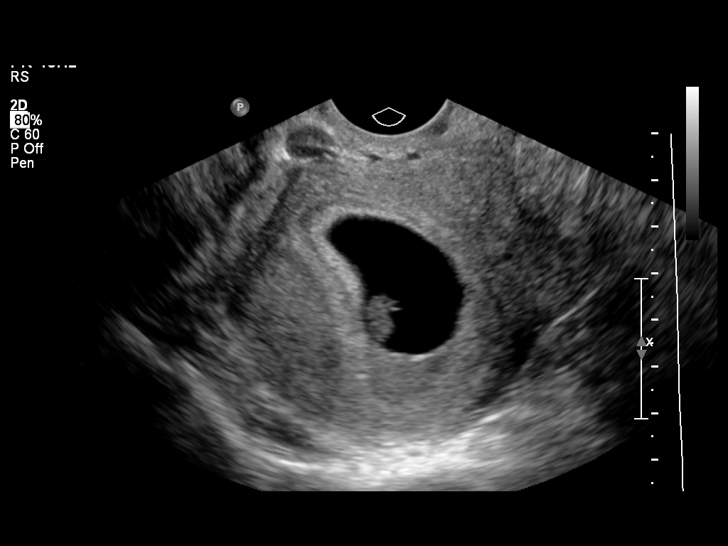
[im 22/35]
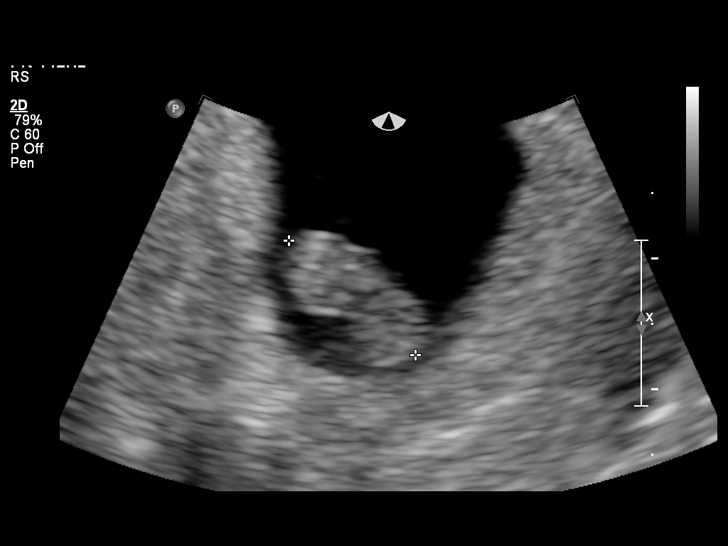
[im 24/35]
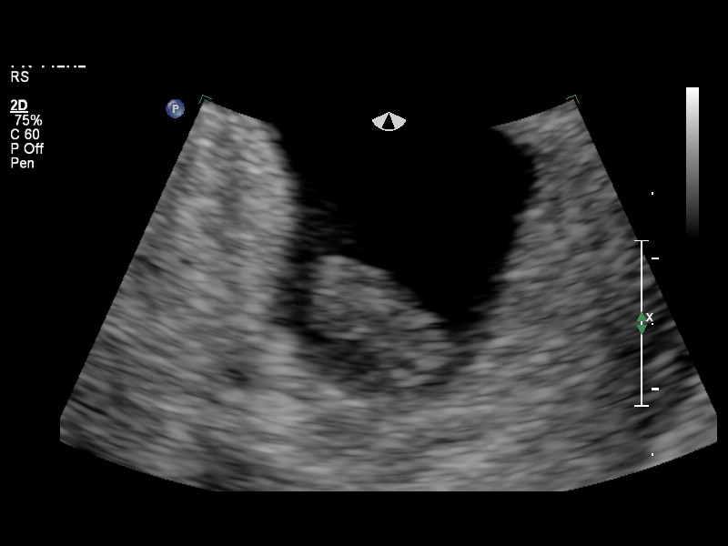
[im 27/35]
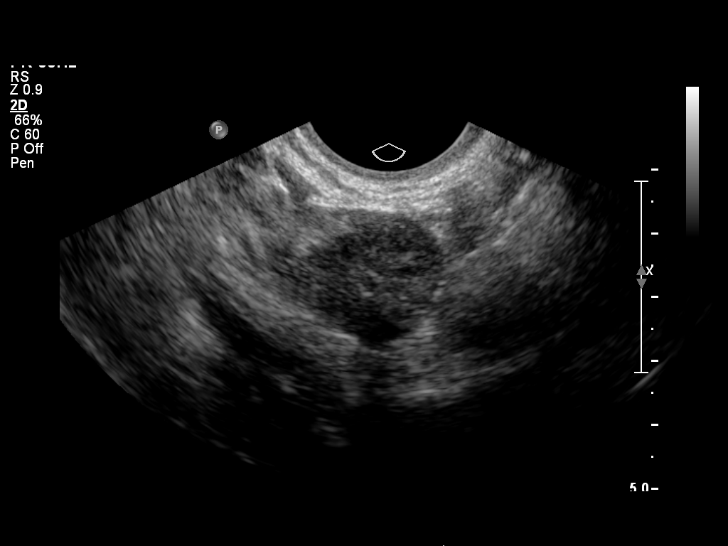
[im 29/35]
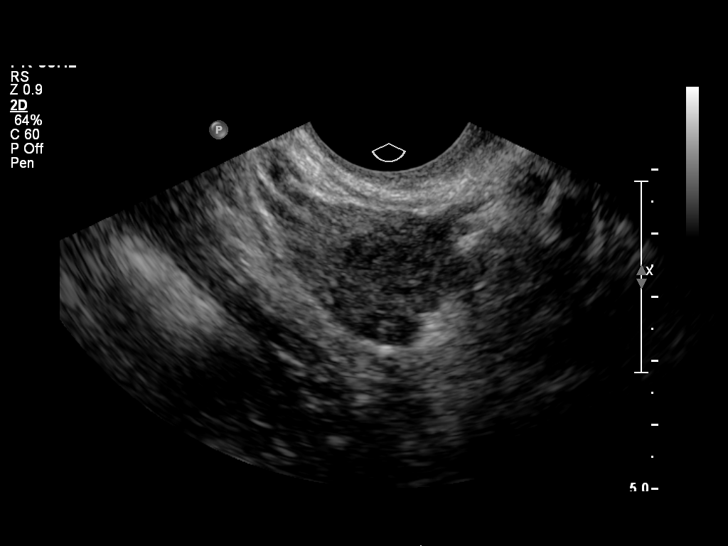
[im 32/35]
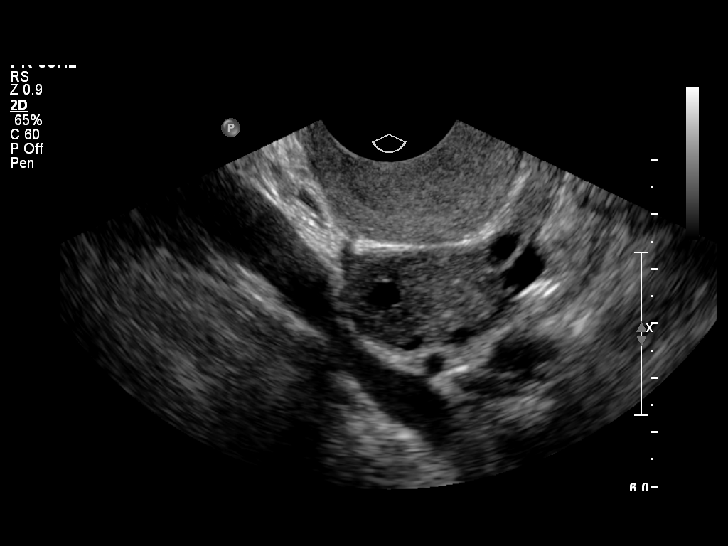
[im 35/35]
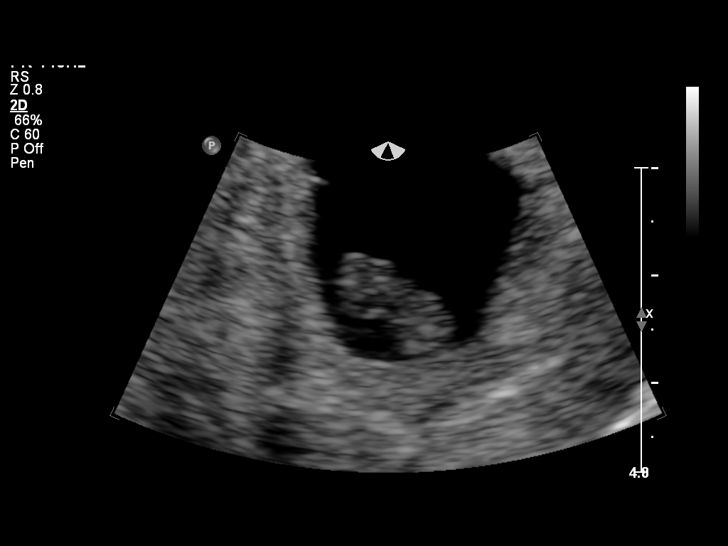

[14 of 28 positions shown; findings below may reference images not displayed]

Intrauterine gestational sac:  Present
Yolk sac: Not seen
Embryo: Present
Cardiac Activity: Not seen
Heart Rate: Zero bpm

CRL: 13.1  mm  7 w  4 d
Maternal uterus/adnexae:
The ovaries have a normal appearance.  Small anterior fibroid is
2.2 x 1.2 x 1.8 cm.  Posterior fibroid is 1.0 x 0.5 x 1.8 cm.
IMPRESSION: 1.  Early intrauterine fetal demise.
2.  Small uterine fibroids identified.

## 2015-01-18 ENCOUNTER — Ambulatory Visit (HOSPITAL_COMMUNITY): Admission: RE | Admit: 2015-01-18 | Payer: Self-pay | Source: Ambulatory Visit

## 2015-01-20 ENCOUNTER — Ambulatory Visit (INDEPENDENT_AMBULATORY_CARE_PROVIDER_SITE_OTHER): Payer: Self-pay | Admitting: Advanced Practice Midwife

## 2015-01-20 VITALS — BP 130/83 | HR 73 | Wt 189.7 lb

## 2015-01-20 DIAGNOSIS — Z3493 Encounter for supervision of normal pregnancy, unspecified, third trimester: Secondary | ICD-10-CM

## 2015-01-20 LAB — POCT URINALYSIS DIP (DEVICE)
BILIRUBIN URINE: NEGATIVE
Glucose, UA: NEGATIVE mg/dL
Ketones, ur: NEGATIVE mg/dL
Leukocytes, UA: NEGATIVE
NITRITE: NEGATIVE
Protein, ur: >=300 mg/dL — AB
Specific Gravity, Urine: 1.025 (ref 1.005–1.030)
Urobilinogen, UA: 0.2 mg/dL (ref 0.0–1.0)
pH: 7 (ref 5.0–8.0)

## 2015-01-20 NOTE — Addendum Note (Signed)
Addended by: Seabron Spates on: 01/20/2015 10:19 AM   Modules accepted: Medications

## 2015-01-20 NOTE — Progress Notes (Signed)
Doing well 

## 2015-01-20 NOTE — Progress Notes (Signed)
Cultures next visit

## 2015-01-20 NOTE — Patient Instructions (Signed)
Tercer trimestre de Media planner (Third Trimester of Pregnancy) El tercer trimestre va desde la semana29 hasta la 88, desde el sptimo hasta el noveno mes, y es la poca en la que el feto crece ms rpidamente. Hacia el final del noveno mes, el feto mide alrededor de 20pulgadas (45cm) de largo y pesa entre 6 y 47 libras (2,700 y 37,500kg).  CAMBIOS EN EL ORGANISMO Su organismo atraviesa por muchos cambios durante el [place], y estos varan de Ardelia Mems mujer a Theatre manager.   Seguir [employer]. Es de esperar que aumente entre 25 y 35libras (60 y 16kg) hacia el final del Media planner.  Podrn aparecer las primeras [employer] caderas, el abdomen y las [place].  Puede tener necesidad de Garment/textile technologist con ms frecuencia porque el feto baja hacia la pelvis y ejerce presin sobre la vejiga.  Debido al Glennis Brink podr sentir Victorio Palm estomacal con frecuencia.  Puede estar estreida, ya que ciertas hormonas enlentecen los movimientos de los msculos que [employer] desechos a travs de los intestinos.  Pueden aparecer hemorroides o abultarse e hincharse las venas (venas varicosas).  Puede sentir dolor plvico debido al [employer] y a que las hormonas del Scientist, research (life sciences) las articulaciones entre los huesos de la pelvis. El dolor de espalda puede ser consecuencia de la sobrecarga de los msculos que soportan la [place].  Tal vez haya cambios en el cabello que pueden incluir su engrosamiento, crecimiento rpido y cambios en la textura. Adems, a algunas mujeres se les cae el cabello durante o despus del embarazo, o tienen el cabello seco o fino. Lo ms probable es que el cabello se le normalice despus del nacimiento del beb.  Las [employer] seguirn creciendo y Teaching laboratory technician. A veces, puede haber una secrecin amarilla de las mamas llamada calostro.  El ombligo puede salir hacia afuera.  Puede sentir que le falta el aire debido a que se expande el tero.  Puede notar que el feto "baja" o lo siente ms bajo, en el  abdomen.  Puede tener una prdida de secrecin mucosa con sangre. Esto suele ocurrir en el trmino de unos pocos das a una semana antes de que comience el [place].  El cuello del tero se vuelve delgado y blando (se borra) cerca de la fecha de [place]. QU DEBE ESPERAR EN LOS EXMENES PRENATALES  Le harn exmenes prenatales cada 2semanas hasta la semana36. A partir de ese momento le harn exmenes semanales. Durante una visita prenatal de rutina:  La pesarn para asegurarse de que usted y el feto estn creciendo normalmente.  Le tomarn la presin arterial.  Le medirn el abdomen para controlar el desarrollo del beb.  Se escucharn los latidos cardacos fetales.  Se evaluarn los resultados de los estudios solicitados en visitas anteriores.  Le revisarn el cuello del tero cuando est prxima la fecha de parto para controlar si este se ha borrado. Alrededor de la semana36, el mdico le revisar el cuello del tero. Al mismo tiempo, realizar un anlisis de las secreciones del tejido vaginal. Este examen es para determinar si hay un tipo de bacteria, estreptococo Grupo B. El mdico le explicar esto con ms detalle. El mdico puede preguntarle lo siguiente:  Cmo le gustara que fuera el [place].  Cmo se siente.  Si siente los movimientos del beb.  Si ha tenido sntomas anormales, como prdida de lquido, [place], dolores de cabeza intensos o clicos abdominales.  Si tiene [employer]. Otros exmenes o estudios de deteccin que pueden realizarse  durante el tercer trimestre incluyen lo siguiente:  Anlisis de sangre para controlar las concentraciones de hierro (anemia).  Controles fetales para determinar su salud, nivel de [place] y Mining engineer. Si tiene [place] enfermedad o hay problemas durante el embarazo, le harn estudios. FALSO TRABAJO DE PARTO Es posible que sienta contracciones leves e irregulares que finalmente desaparecen. Se llaman contracciones de  Braxton Hicks o falso trabajo de [place]. Las Yahoo pueden durar horas, das o incluso semanas, antes de que el verdadero trabajo de parto se inicie. Si las contracciones ocurren a intervalos regulares, se intensifican o se hacen dolorosas, lo mejor es que la revise el mdico.  SIGNOS DE TRABAJO DE PARTO   Clicos de tipo menstrual.  Contracciones cada 54minutos o menos.  Contracciones que comienzan en la parte superior del tero y se extienden hacia abajo, a la zona inferior del abdomen y la espalda.  Sensacin de mayor presin en la pelvis o dolor de espalda.  Una secrecin de mucosidad acuosa o con sangre que sale de la vagina. Si tiene alguno de estos signos antes de la [id number] del Media planner, llame a su mdico de inmediato. Debe concurrir al hospital para que la controlen inmediatamente. INSTRUCCIONES PARA EL CUIDADO EN EL HOGAR   Evite fumar, consumir hierbas, beber alcohol y tomar frmacos que no le hayan recetado. Estas sustancias qumicas afectan la formacin y el desarrollo del beb.  [place] mdico en relacin con el uso de medicamentos. Durante el embarazo, hay medicamentos que son seguros de tomar y otros que no.  Haga actividad fsica solo en la forma indicada por el mdico. Sentir clicos uterinos es un buen signo para Ambulance person actividad fsica.  Contine comiendo alimentos que sanos con regularidad.  Use un sostn que le brinde buen soporte si le [employer].  No se d baos de inmersin en agua caliente, baos turcos ni saunas.  Colquese el cinturn de seguridad cuando conduzca.  No coma carne cruda ni queso sin cocinar; evite el contacto con las bandejas sanitarias de los gatos y la tierra que estos animales usan. Estos elementos contienen grmenes que pueden causar defectos congnitos en el beb.  [place].  Si est estreida, pruebe un laxante suave (si el mdico lo autoriza). Consuma ms alimentos ricos en  fibra, como vegetales y frutas frescos y Psychologist, prison and probation services. Beba gran cantidad de lquido para mantener la orina de tono claro o color amarillo plido.  Dese baos de asiento con agua tibia para Best boy o las molestias causadas por las hemorroides. Use una crema para las hemorroides si el mdico la autoriza.  Si tiene venas varicosas, use medias de descanso. Eleve los pies durante 72minutos, 3 o 4veces por da. Limite la cantidad de sal en su dieta.  Evite levantar objetos pesados, use zapatos de tacones bajos y [place].  Descanse con las piernas elevadas si tiene calambres o dolor de cintura.  Visite a su dentista si no lo ha Quarry manager. Use un cepillo de dientes blando para higienizarse los dientes y psese el hilo dental con suavidad.  Puede seguir [employer], a menos que el mdico le indique lo contrario.  No haga viajes largos excepto que sea absolutamente necesario y solo con la autorizacin del [place] clases prenatales para Development worker, international aid, Psychologist, prison and probation services y hacer preguntas sobre el [place] de parto y [place].  Haga un ensayo de la partida al hospital.  Prepare el bolso que  llevar al hospital.  Prepare la habitacin del beb.  Concurra a todas las visitas prenatales segn las indicaciones de su mdico. SOLICITE ATENCIN MDICA SI:  No est segura de que est en trabajo de parto o de que ha roto la bolsa de las aguas.  Tiene mareos.  Siente clicos leves, presin en la pelvis o dolor persistente en el abdomen.  Tiene nuseas, vmitos o diarrea persistentes.  Tiene secrecin vaginal con mal olor.  Siente dolor al [employer]. SOLICITE ATENCIN MDICA DE INMEDIATO SI:   Tiene fiebre.  Tiene una prdida de lquido por la vagina.  Tiene sangrado o pequeas prdidas vaginales.  Siente dolor intenso o clicos en el abdomen.  Sube o baja de peso rpidamente.  Tiene dificultad para respirar y siente dolor de  pecho.  Sbitamente se le hinchan mucho el rostro, las [place], los tobillos, los pies o las piernas.  No ha sentido los movimientos del beb durante Leone Brand.  Siente un dolor de cabeza intenso que no se alivia con medicamentos.  Hay cambios en la visin. Document Released: 09/20/2005 Document Revised: 12/16/2013 [facility] Patient Information 2015 [place]. This information is not intended to replace advice given to you by your health care provider. Make sure you discuss any questions you have with your health care provider.

## 2015-02-03 ENCOUNTER — Ambulatory Visit (INDEPENDENT_AMBULATORY_CARE_PROVIDER_SITE_OTHER): Payer: Self-pay | Admitting: Physician Assistant

## 2015-02-03 VITALS — BP 123/78 | HR 58 | Temp 98.2°F | Wt 198.2 lb

## 2015-02-03 DIAGNOSIS — Z3483 Encounter for supervision of other normal pregnancy, third trimester: Secondary | ICD-10-CM

## 2015-02-03 DIAGNOSIS — Z3493 Encounter for supervision of normal pregnancy, unspecified, third trimester: Secondary | ICD-10-CM

## 2015-02-03 DIAGNOSIS — O1213 Gestational proteinuria, third trimester: Secondary | ICD-10-CM

## 2015-02-03 LAB — POCT URINALYSIS DIP (DEVICE)
Bilirubin Urine: NEGATIVE
Glucose, UA: NEGATIVE mg/dL
Ketones, ur: NEGATIVE mg/dL
Leukocytes, UA: NEGATIVE
NITRITE: NEGATIVE
PH: 7 (ref 5.0–8.0)
Protein, ur: >=300 mg/dL — AB
Specific Gravity, Urine: 1.025 (ref 1.005–1.030)
Urobilinogen, UA: 0.2 mg/dL (ref 0.0–1.0)

## 2015-02-03 LAB — OB RESULTS CONSOLE GC/CHLAMYDIA
CHLAMYDIA, DNA PROBE: NEGATIVE
GC PROBE AMP, GENITAL: NEGATIVE

## 2015-02-03 LAB — OB RESULTS CONSOLE GBS: GBS: NEGATIVE

## 2015-02-03 NOTE — Progress Notes (Signed)
GBS and cultures today.  

## 2015-02-03 NOTE — Progress Notes (Signed)
No concerns. +FM but not as much as before. No contractions but pain in pelvic region. No pressure. Nml discharge, no bleeding, No LOF.   Feeding: Breastfeed Pediatrician: Unknown, [facility] to [facility].  Interpreter used during visit.   Luiz Blare, DO 02/03/2015, 9:06 AM PGY-1, [place]

## 2015-02-03 NOTE — Patient Instructions (Signed)
Tercer trimestre de Media planner (Third Trimester of Pregnancy) El tercer trimestre va desde la semana29 hasta la 48, desde el sptimo hasta el noveno mes, y es la poca en la que el feto crece ms rpidamente. Hacia el final del noveno mes, el feto mide alrededor de 20pulgadas (45cm) de largo y pesa entre 6 y 58 libras (2,700 y 58,500kg).  CAMBIOS EN EL ORGANISMO Su organismo atraviesa por muchos cambios durante el [place], y estos varan de Ardelia Mems mujer a Theatre manager.   Seguir [employer]. Es de esperar que aumente entre 25 y 35libras (15 y 16kg) hacia el final del Media planner.  Podrn aparecer las primeras [employer] caderas, el abdomen y las [place].  Puede tener necesidad de Garment/textile technologist con ms frecuencia porque el feto baja hacia la pelvis y ejerce presin sobre la vejiga.  Debido al Glennis Brink podr sentir Victorio Palm estomacal con frecuencia.  Puede estar estreida, ya que ciertas hormonas enlentecen los movimientos de los msculos que [employer] desechos a travs de los intestinos.  Pueden aparecer hemorroides o abultarse e hincharse las venas (venas varicosas).  Puede sentir dolor plvico debido al [employer] y a que las hormonas del Scientist, research (life sciences) las articulaciones entre los huesos de la pelvis. El dolor de espalda puede ser consecuencia de la sobrecarga de los msculos que soportan la [place].  Tal vez haya cambios en el cabello que pueden incluir su engrosamiento, crecimiento rpido y cambios en la textura. Adems, a algunas mujeres se les cae el cabello durante o despus del embarazo, o tienen el cabello seco o fino. Lo ms probable es que el cabello se le normalice despus del nacimiento del beb.  Las [employer] seguirn creciendo y Teaching laboratory technician. A veces, puede haber una secrecin amarilla de las mamas llamada calostro.  El ombligo puede salir hacia afuera.  Puede sentir que le falta el aire debido a que se expande el tero.  Puede notar que el feto "baja" o lo siente ms bajo, en el  abdomen.  Puede tener una prdida de secrecin mucosa con sangre. Esto suele ocurrir en el trmino de unos pocos das a una semana antes de que comience el [place].  El cuello del tero se vuelve delgado y blando (se borra) cerca de la fecha de [place]. QU DEBE ESPERAR EN LOS EXMENES PRENATALES  Le harn exmenes prenatales cada 2semanas hasta la semana36. A partir de ese momento le harn exmenes semanales. Durante una visita prenatal de rutina:  La pesarn para asegurarse de que usted y el feto estn creciendo normalmente.  Le tomarn la presin arterial.  Le medirn el abdomen para controlar el desarrollo del beb.  Se escucharn los latidos cardacos fetales.  Se evaluarn los resultados de los estudios solicitados en visitas anteriores.  Le revisarn el cuello del tero cuando est prxima la fecha de parto para controlar si este se ha borrado. Alrededor de la semana36, el mdico le revisar el cuello del tero. Al mismo tiempo, realizar un anlisis de las secreciones del tejido vaginal. Este examen es para determinar si hay un tipo de bacteria, estreptococo Grupo B. El mdico le explicar esto con ms detalle. El mdico puede preguntarle lo siguiente:  Cmo le gustara que fuera el [place].  Cmo se siente.  Si siente los movimientos del beb.  Si ha tenido sntomas anormales, como prdida de lquido, [place], dolores de cabeza intensos o clicos abdominales.  Si tiene [employer]. Otros exmenes o estudios de deteccin que pueden realizarse  durante el tercer trimestre incluyen lo siguiente:  Anlisis de sangre para controlar las concentraciones de hierro (anemia).  Controles fetales para determinar su salud, nivel de [place] y Mining engineer. Si tiene [place] enfermedad o hay problemas durante el embarazo, le harn estudios. FALSO TRABAJO DE PARTO Es posible que sienta contracciones leves e irregulares que finalmente desaparecen. Se llaman contracciones de  Braxton Hicks o falso trabajo de [place]. Las Yahoo pueden durar horas, das o incluso semanas, antes de que el verdadero trabajo de parto se inicie. Si las contracciones ocurren a intervalos regulares, se intensifican o se hacen dolorosas, lo mejor es que la revise el mdico.  SIGNOS DE TRABAJO DE PARTO   Clicos de tipo menstrual.  Contracciones cada 50minutos o menos.  Contracciones que comienzan en la parte superior del tero y se extienden hacia abajo, a la zona inferior del abdomen y la espalda.  Sensacin de mayor presin en la pelvis o dolor de espalda.  Una secrecin de mucosidad acuosa o con sangre que sale de la vagina. Si tiene alguno de estos signos antes de la [id number] del Media planner, llame a su mdico de inmediato. Debe concurrir al hospital para que la controlen inmediatamente. INSTRUCCIONES PARA EL CUIDADO EN EL HOGAR   Evite fumar, consumir hierbas, beber alcohol y tomar frmacos que no le hayan recetado. Estas sustancias qumicas afectan la formacin y el desarrollo del beb.  [place] mdico en relacin con el uso de medicamentos. Durante el embarazo, hay medicamentos que son seguros de tomar y otros que no.  Haga actividad fsica solo en la forma indicada por el mdico. Sentir clicos uterinos es un buen signo para Ambulance person actividad fsica.  Contine comiendo alimentos que sanos con regularidad.  Use un sostn que le brinde buen soporte si le [employer].  No se d baos de inmersin en agua caliente, baos turcos ni saunas.  Colquese el cinturn de seguridad cuando conduzca.  No coma carne cruda ni queso sin cocinar; evite el contacto con las bandejas sanitarias de los gatos y la tierra que estos animales usan. Estos elementos contienen grmenes que pueden causar defectos congnitos en el beb.  [place].  Si est estreida, pruebe un laxante suave (si el mdico lo autoriza). Consuma ms alimentos ricos en  fibra, como vegetales y frutas frescos y Psychologist, prison and probation services. Beba gran cantidad de lquido para mantener la orina de tono claro o color amarillo plido.  Dese baos de asiento con agua tibia para Best boy o las molestias causadas por las hemorroides. Use una crema para las hemorroides si el mdico la autoriza.  Si tiene venas varicosas, use medias de descanso. Eleve los pies durante 36minutos, 3 o 4veces por da. Limite la cantidad de sal en su dieta.  Evite levantar objetos pesados, use zapatos de tacones bajos y [place].  Descanse con las piernas elevadas si tiene calambres o dolor de cintura.  Visite a su dentista si no lo ha Quarry manager. Use un cepillo de dientes blando para higienizarse los dientes y psese el hilo dental con suavidad.  Puede seguir [employer], a menos que el mdico le indique lo contrario.  No haga viajes largos excepto que sea absolutamente necesario y solo con la autorizacin del [place] clases prenatales para Development worker, international aid, Psychologist, prison and probation services y hacer preguntas sobre el [place] de parto y [place].  Haga un ensayo de la partida al hospital.  Prepare el bolso que  llevar al hospital.  Prepare la habitacin del beb.  Concurra a todas las visitas prenatales segn las indicaciones de su mdico. SOLICITE ATENCIN MDICA SI:  No est segura de que est en trabajo de parto o de que ha roto la bolsa de las aguas.  Tiene mareos.  Siente clicos leves, presin en la pelvis o dolor persistente en el abdomen.  Tiene nuseas, vmitos o diarrea persistentes.  Tiene secrecin vaginal con mal olor.  Siente dolor al [employer]. SOLICITE ATENCIN MDICA DE INMEDIATO SI:   Tiene fiebre.  Tiene una prdida de lquido por la vagina.  Tiene sangrado o pequeas prdidas vaginales.  Siente dolor intenso o clicos en el abdomen.  Sube o baja de peso rpidamente.  Tiene dificultad para respirar y siente dolor de  pecho.  Sbitamente se le hinchan mucho el rostro, las [place], los tobillos, los pies o las piernas.  No ha sentido los movimientos del beb durante Leone Brand.  Siente un dolor de cabeza intenso que no se alivia con medicamentos.  Hay cambios en la visin. Document Released: 09/20/2005 Document Revised: 12/16/2013 [facility] Patient Information 2015 [place]. This information is not intended to replace advice given to you by your health care provider. Make sure you discuss any questions you have with your health care provider.

## 2015-02-04 LAB — PROTEIN / CREATININE RATIO, URINE
CREATININE, URINE: 122.9 mg/dL
Protein Creatinine Ratio: 8.19 — ABNORMAL HIGH (ref <0.15)
Total Protein, Urine: 1006 mg/dL — ABNORMAL HIGH (ref 5–24)

## 2015-02-04 LAB — GC/CHLAMYDIA PROBE AMP
CT Probe RNA: NEGATIVE
GC PROBE AMP APTIMA: NEGATIVE

## 2015-02-05 LAB — CULTURE, BETA STREP (GROUP B ONLY)

## 2015-02-10 ENCOUNTER — Ambulatory Visit (INDEPENDENT_AMBULATORY_CARE_PROVIDER_SITE_OTHER): Payer: Self-pay | Admitting: Advanced Practice Midwife

## 2015-02-10 ENCOUNTER — Inpatient Hospital Stay (HOSPITAL_COMMUNITY)
Admission: AD | Admit: 2015-02-10 | Discharge: 2015-02-14 | DRG: 765 | Disposition: A | Discharge status: Home / Self Care | Payer: Medicaid Other | Source: Ambulatory Visit | Attending: Obstetrics and Gynecology | Admitting: Obstetrics and Gynecology

## 2015-02-10 ENCOUNTER — Encounter (HOSPITAL_COMMUNITY): Payer: Self-pay | Admitting: *Deleted

## 2015-02-10 VITALS — BP 150/76 | HR 61 | Wt 200.8 lb

## 2015-02-10 DIAGNOSIS — O365930 Maternal care for other known or suspected poor fetal growth, third trimester, not applicable or unspecified: Secondary | ICD-10-CM | POA: Diagnosis present

## 2015-02-10 DIAGNOSIS — Z823 Family history of stroke: Secondary | ICD-10-CM | POA: Diagnosis not present

## 2015-02-10 DIAGNOSIS — Z6836 Body mass index (BMI) 36.0-36.9, adult: Secondary | ICD-10-CM

## 2015-02-10 DIAGNOSIS — O99213 Obesity complicating pregnancy, third trimester: Secondary | ICD-10-CM | POA: Diagnosis present

## 2015-02-10 DIAGNOSIS — Z833 Family history of diabetes mellitus: Secondary | ICD-10-CM

## 2015-02-10 DIAGNOSIS — O09523 Supervision of elderly multigravida, third trimester: Secondary | ICD-10-CM

## 2015-02-10 DIAGNOSIS — Z3A37 37 weeks gestation of pregnancy: Secondary | ICD-10-CM | POA: Diagnosis present

## 2015-02-10 DIAGNOSIS — L03311 Cellulitis of abdominal wall: Secondary | ICD-10-CM | POA: Diagnosis not present

## 2015-02-10 DIAGNOSIS — O1403 Mild to moderate pre-eclampsia, third trimester: Secondary | ICD-10-CM | POA: Diagnosis present

## 2015-02-10 DIAGNOSIS — O3413 Maternal care for benign tumor of corpus uteri, third trimester: Secondary | ICD-10-CM | POA: Diagnosis present

## 2015-02-10 DIAGNOSIS — Z3490 Encounter for supervision of normal pregnancy, unspecified, unspecified trimester: Secondary | ICD-10-CM

## 2015-02-10 DIAGNOSIS — D259 Leiomyoma of uterus, unspecified: Secondary | ICD-10-CM | POA: Diagnosis present

## 2015-02-10 DIAGNOSIS — O1493 Unspecified pre-eclampsia, third trimester: Secondary | ICD-10-CM

## 2015-02-10 DIAGNOSIS — O1490 Unspecified pre-eclampsia, unspecified trimester: Secondary | ICD-10-CM | POA: Diagnosis present

## 2015-02-10 LAB — POCT URINALYSIS DIP (DEVICE)
Bilirubin Urine: NEGATIVE
GLUCOSE, UA: NEGATIVE mg/dL
Ketones, ur: NEGATIVE mg/dL
Leukocytes, UA: NEGATIVE
NITRITE: NEGATIVE
Specific Gravity, Urine: 1.025 (ref 1.005–1.030)
Urobilinogen, UA: 0.2 mg/dL (ref 0.0–1.0)
pH: 7 (ref 5.0–8.0)

## 2015-02-10 LAB — COMPREHENSIVE METABOLIC PANEL
ALT: 21 U/L (ref 0–35)
ANION GAP: 4 — AB (ref 5–15)
AST: 28 U/L (ref 0–37)
Albumin: 2.5 g/dL — ABNORMAL LOW (ref 3.5–5.2)
Alkaline Phosphatase: 190 U/L — ABNORMAL HIGH (ref 39–117)
BILIRUBIN TOTAL: 0.5 mg/dL (ref 0.3–1.2)
BUN: 14 mg/dL (ref 6–23)
CHLORIDE: 109 mmol/L (ref 96–112)
CO2: 21 mmol/L (ref 19–32)
Calcium: 8 mg/dL — ABNORMAL LOW (ref 8.4–10.5)
Creatinine, Ser: 0.56 mg/dL (ref 0.50–1.10)
GFR calc Af Amer: >90 mL/min (ref >90)
Glucose, Bld: 77 mg/dL (ref 70–99)
POTASSIUM: 3.5 mmol/L (ref 3.5–5.1)
Sodium: 134 mmol/L — ABNORMAL LOW (ref 135–145)
Total Protein: 6 g/dL (ref 6.0–8.3)

## 2015-02-10 LAB — CBC
HCT: 36.2 % (ref 36.0–46.0)
Hemoglobin: 12.2 g/dL (ref 12.0–15.0)
MCH: 27.1 pg (ref 26.0–34.0)
MCHC: 33.7 g/dL (ref 30.0–36.0)
MCV: 80.4 fL (ref 78.0–100.0)
Platelets: 141 10*3/uL — ABNORMAL LOW (ref 150–400)
RBC: 4.5 MIL/uL (ref 3.87–5.11)
RDW: 15.5 % (ref 11.5–15.5)
WBC: 9.8 10*3/uL (ref 4.0–10.5)

## 2015-02-10 LAB — TYPE AND SCREEN
ABO/RH(D): O POS
ANTIBODY SCREEN: NEGATIVE

## 2015-02-10 MED ORDER — LIDOCAINE HCL (PF) 1 % IJ SOLN: 30.0000 mL | INTRAMUSCULAR | Status: DC | PRN

## 2015-02-10 MED ORDER — OXYTOCIN 40 UNITS IN LACTATED RINGERS INFUSION - SIMPLE MED: 62.5000 mL/h | INTRAVENOUS | Status: DC

## 2015-02-10 MED ORDER — FLEET ENEMA 7-19 GM/118ML RE ENEM: 1.0000 | ENEMA | RECTAL | Status: DC | PRN

## 2015-02-10 MED ORDER — OXYTOCIN BOLUS FROM INFUSION: 500.0000 mL | INTRAVENOUS | Status: DC

## 2015-02-10 MED ORDER — INFLUENZA VAC SPLIT QUAD 0.5 ML IM SUSY
0.5000 mL | PREFILLED_SYRINGE | INTRAMUSCULAR | Status: DC
  Filled 2015-02-10: qty 0.5

## 2015-02-10 MED ORDER — MISOPROSTOL 25 MCG QUARTER TABLET
25.0000 ug | ORAL_TABLET | ORAL | Status: DC | PRN
  Administered 2015-02-10: 25 ug via VAGINAL
  Filled 2015-02-10: qty 0.25

## 2015-02-10 MED ORDER — ACETAMINOPHEN 325 MG PO TABS: 650.0000 mg | ORAL_TABLET | ORAL | Status: DC | PRN

## 2015-02-10 MED ORDER — CITRIC ACID-SODIUM CITRATE 334-500 MG/5ML PO SOLN
30.0000 mL | ORAL | Status: DC | PRN
  Administered 2015-02-11: 30 mL via ORAL
  Filled 2015-02-10: qty 15

## 2015-02-10 MED ORDER — ONDANSETRON HCL 4 MG/2ML IJ SOLN: 4.0000 mg | Freq: Four times a day (QID) | INTRAMUSCULAR | Status: DC | PRN

## 2015-02-10 MED ORDER — OXYCODONE-ACETAMINOPHEN 5-325 MG PO TABS: 1.0000 | ORAL_TABLET | ORAL | Status: DC | PRN

## 2015-02-10 MED ORDER — OXYCODONE-ACETAMINOPHEN 5-325 MG PO TABS: 2.0000 | ORAL_TABLET | ORAL | Status: DC | PRN

## 2015-02-10 MED ORDER — MAGNESIUM SULFATE BOLUS VIA INFUSION
4.0000 g | Freq: Once | INTRAVENOUS | Status: AC
  Administered 2015-02-10: 4 g via INTRAVENOUS
  Filled 2015-02-10: qty 500

## 2015-02-10 MED ORDER — LACTATED RINGERS IV SOLN: 500.0000 mL | INTRAVENOUS | Status: DC | PRN

## 2015-02-10 MED ORDER — LACTATED RINGERS IV SOLN
INTRAVENOUS | Status: DC
  Administered 2015-02-10 – 2015-02-11 (×2): via INTRAVENOUS

## 2015-02-10 MED ORDER — MAGNESIUM SULFATE 40 G IN LACTATED RINGERS - SIMPLE
2.0000 g/h | INTRAVENOUS | Status: AC
  Administered 2015-02-10 – 2015-02-11 (×2): 2 g/h via INTRAVENOUS
  Filled 2015-02-10 (×2): qty 500

## 2015-02-10 MED ORDER — HYDRALAZINE HCL 20 MG/ML IJ SOLN
10.0000 mg | INTRAMUSCULAR | Status: DC | PRN
  Administered 2015-02-10 (×2): 10 mg via INTRAVENOUS
  Filled 2015-02-10 (×2): qty 1

## 2015-02-10 MED ORDER — TERBUTALINE SULFATE 1 MG/ML IJ SOLN
0.2500 mg | Freq: Once | INTRAMUSCULAR | Status: AC | PRN
  Filled 2015-02-10: qty 1

## 2015-02-10 NOTE — Patient Instructions (Signed)
Preeclampsia y eclampsia °(Preeclampsia and Eclampsia) °La preeclampsia es una afección grave que solo se manifiesta durante el embarazo. También se la conoce como toxemia del embarazo. Esta afección provoca el aumento de la presión arterial junto con otros síntomas, como hinchazón y dolores de cabeza, que pueden aparecer a medida que la preeclampsia empeora. La preeclampsia puede presentarse a las 20 semanas de gestación o posteriormente.  °El diagnóstico y el tratamiento tempranos de esta afección son muy importantes. Si no se la trata de inmediato, puede causarles problemas graves a usted y al bebé. Una complicación que la preeclampsia puede provocar es la eclampsia, una afección que causa temblores o contracciones musculares (convulsiones) en la madre. Provocar el parto del bebé es el mejor tratamiento para la preeclampsia o la eclampsia.  °FACTORES DE RIESGO °La causa de la preeclampsia no se conoce. Puede tener más probabilidades de desarrollar la afección si tiene ciertos factores de riesgo. Estos incluyen:  °· Estar embarazada por primera vez. °· Haber tenido preeclampsia en un embarazo anterior. °· Tener antecedentes familiares de preeclampsia. °· Tener presión arterial alta. °· Estar embarazada de gemelos o trillizos. °· Tener 35 años o más. °· Pertenecer a la raza afroamericana. °· Tener enfermedad renal o diabetes. °· Tener enfermedades, como lupus o enfermedades de la sangre. °· Tener mucho sobrepeso (obesidad). °SIGNOS Y SÍNTOMAS  °Los primeros signos de preeclampsia son: °· Hipertensión arterial. °· Aumento de las proteínas en la orina. El médico la controlará en cada visita prenatal. °Otros síntomas que pueden presentarse incluyen:  °· Dolor de cabeza intenso °· Aumento repentino de peso. °· Hinchazón de las manos, el rostro, las piernas y los pies. °· Malestar estomacal (náuseas) y (vómitos). °· Problemas visuales (visión doble o borrosa). °· Adormecimiento del rostro, los brazos, las piernas y los  pies. °· Mareos. °· Hablar arrastrando las palabras. °· Sensibilidad a las luces brillantes. °· Dolor abdominal. °DIAGNÓSTICO  °No hay pruebas diagnósticas para la preeclampsia. El médico le hará preguntas sobre los síntomas y verificará la presencia de signos de preeclampsia durante las visitas prenatales. También pueden hacerle exámenes que incluyen: °· Análisis de orina. °· Análisis de sangre. °· Control de la frecuencia cardíaca del bebé. °· Control de la salud del bebé y de la placenta mediante el uso de imágenes que se generan con ondas sonoras (ecografía). °TRATAMIENTO  °Puede planificar el mejor abordaje de tratamiento junto con el médico. Es muy importante que concurra a todas las visitas prenatales. Si tiene un riesgo más alto de tener preeclampsia, tal vez necesite exámenes prenatales con más frecuencia. °· El médico también puede indicarle que haga reposo en cama. °· Tal vez deba comer la menor cantidad de sal posible. °· Es posible que tenga que tomar medicamentos para bajar la presión arterial si la afección no responde a medidas más conservadoras. °· Quizás deba permanecer en el hospital si la afección es grave. Allí, el tratamiento estará centrado en el control de la presión arterial y la retención de líquidos. Puede que también tenga que tomar medicamentos para evitar las convulsiones. °· Si la afección empeora, tal vez sea necesario adelantar el parto para protegerlos a usted y al bebé. Pueden provocarle el trabajo de parto con medicamentos (inducirse) o hacerle una cesárea. °· Generalmente, la preeclampsia desaparece después del parto. °INSTRUCCIONES PARA EL CUIDADO EN EL HOGAR  °· Tome los medicamentos de venta libre o recetados solamente según las indicaciones del médico. °· Acuéstese sobre el lado izquierdo mientras hace reposo; de este modo, se quita la   presin del beb.  Levante los pies mientras hace reposo.  Realice actividad fsica con regularidad. Consulte al mdico qu tipo de  ejercicio es seguro para usted.  Evite la cafena y el alcohol.  No fume.  Beba de 6 a 8vasos de Public affairs consultant.  Coma una dieta equilibrada con bajo contenido de sal. No agregue sal a las comidas.  Evite las situaciones estresantes tanto como sea posible.  Descanse y duerma lo suficiente.  Concurra a todas las visitas prenatales y hgase todos los anlisis segn lo programado. SOLICITE ATENCIN MDICA SI:  Aumenta de peso ms de lo esperado.  Tiene dolores de cabeza, dolor abdominal o nuseas.  Aparecen ms hematomas que lo normal.  Se siente mareada o tiene vahdos. SOLICITE ATENCIN MDICA DE INMEDIATO SI:   Aparece una hinchazn repentina o tiene una zona muy hinchada en cualquier parte del cuerpo. Esto suele ocurrir en las piernas.  Tiene un aumento de peso de 5libras (2,3kg) o ms en una semana.  Tiene dolor de cabeza intenso, mareos, problemas visuales o confusin.  Siente un dolor abdominal intenso.  Tiene nuseas o vmitos permanentes.  Tiene convulsiones.  Tiene dificultad para mover cualquier parte del cuerpo.  Tiene adormecimiento en el cuerpo.  Tiene dificultad para hablar.  Tiene cualquier sangrado anormal.  Siente rigidez en el cuello.  Se desmaya. ASEGRESE DE QUE:   Comprende estas instrucciones.  Controlar su afeccin.  Recibir ayuda de inmediato si no mejora o si empeora. Document Released: 09/20/2005 Document Revised: 12/16/2013 [facility] Patient Information 2015 [place]. This information is not intended to replace advice given to you by your health care provider. Make sure you discuss any questions you have with your health care provider.

## 2015-02-10 NOTE — Progress Notes (Signed)
Second elevated BP. > 300 Protein on dip.  Denies HA, vision changes or epigastric pain, but reports 4 days Hx of intermittent chest pain radiating down left arm. No Hx of similar Sx. No SOB, palpitations. Per consult w/ Dr. Harolyn Rutherford will admit to L&D for IOL for Pre-E. Maybe ECG.

## 2015-02-10 NOTE — Progress Notes (Signed)
Desiree Marquez is a 39 y.o. [phone] at [redacted]w[redacted]d  admitted for induction of labor due to preeclampsia, with elevated Pr/Cr ratio of 8.19, after prenatal course followed at [facility].  Subjective: Called to see pt due to concerns over BP, and over questionable decelerations, late vs early. With good beat to beat still present. Pt acknowledges headache x last 4 days, none previously, Pt has several bp over 160,systolic and will be started on Magnesium Sulfate. FOLEY bulb inserted in cervix, which is far to pt left.    Objective: BP 160/78 mmHg  Pulse 63  Temp(Src) 98.7 F (37.1 C) (Oral)  Resp 20  Ht 5\' 2"  (1.575 m)  Wt 200 lb (90.719 kg)  BMI 36.57 kg/m2  LMP 05/26/2014 (Exact Date)      FHT:  FHR: 148 bpm, variability: moderate,  accelerations:  Present,  decelerations:  Present less than 50 % of contractions, lates vs early.  UC:   irregular, every 4-5  minutes SVE:   Dilation: 1 Effacement (%): 20 Station: -3 Exam by:: Poe CNM  Re-exam by JVF at 6 am: 2/40/-2 and far to left.Foley bulb inserted and filled to60 cc. Labs: Lab Results  Component Value Date   WBC 9.8 02/10/2015   HGB 12.2 02/10/2015   HCT 36.2 02/10/2015   MCV 80.4 02/10/2015   PLT 141* 02/10/2015    Assessment / Plan: IOL for PreEclampsia, now meets severe criteria, now to receive mag sulfate.  Labor: cytotec earlier, now foley bulb. Preeclampsia:  on magnesium sulfate Fetal Wellbeing:  Category I at present Pain Control:  Labor support without medications I/D:  n/a Anticipated MOD:  NSVD  Desiree Marquez V 02/10/2015, 6:05 PM

## 2015-02-10 NOTE — Progress Notes (Addendum)
I stopped by patients room  to check on patients needs, I ordered her dinner, also assisted Dr Glo Herring with some explanation about the foley , by Juliann Mule Spanish Interpreter

## 2015-02-10 NOTE — Progress Notes (Signed)
MD ok with oxygen off.

## 2015-02-10 NOTE — Progress Notes (Signed)
Desiree Marquez is a 39 y.o. [phone] at [redacted]w[redacted]d   Subjective: Mostly comfortable; has required prn dose hydralazine  Objective: BP 171/89 mmHg  Pulse 67  Temp(Src) 98 F (36.7 C) (Oral)  Resp 16  Ht 5\' 2"  (1.575 m)  Wt 90.719 kg (200 lb)  BMI 36.57 kg/m2  LMP 05/26/2014 (Exact Date)   Total I/O In: 1210.8 [P.O.:640; I.V.:570.8] Out: 650 [Urine:650]  FHT:  FHR: 150s bpm, variability: moderate,  accelerations:  Abscent,  decelerations:  Present having 10x10accels; occ late onset variables not with q ctx; +SS UC:   irregular, every 2-6 minutes SVE:   Dilation: 4 Effacement (%): 60 Station: -2, -3 Exam by:: k. Addis Bennie, cnm- AROM for sm clear/bld tinged fluid  Labs: Lab Results  Component Value Date   WBC 9.8 02/10/2015   HGB 12.2 02/10/2015   HCT 36.2 02/10/2015   MCV 80.4 02/10/2015   PLT 141* 02/10/2015    Assessment / Plan: IOL process for preeclampsia Cx now favorable FHR changes  Anticipate increased labor now with ROM Hydralazine prn  Jayshaun Phillips CNM 02/10/2015, 10:57 PM

## 2015-02-10 NOTE — Progress Notes (Signed)
I assisted Dr. Gerlean Ren with explanation of care plan.  [place]  Interpreter.

## 2015-02-10 NOTE — Progress Notes (Signed)
I assisted Lisa,RN with admission questions. [place]  Interpreter.

## 2015-02-10 NOTE — Progress Notes (Signed)
Reports baby is moving often, though not as often as before.  C/o of left wrist pain that radiates all the way up to her chest-- reports lasts all day.

## 2015-02-10 NOTE — H&P (Signed)
LABOR ADMISSION HISTORY AND PHYSICAL  Desiree Marquez is a 39 y.o. female [phone] with IUP at [redacted]w[redacted]d by 27week [place] presenting for induction of labor secondary to pre-eclampsia. She reports +FMs, No LOF, no VB, no blurry vision, and RUQ pain.  Complains of chest pain with radiation into left arm x4 days. Complains of headache x4 days. States pain has improved. Notes no history of hypertension with previous pregnancies. Denies any other complications during pregnancy. She plans on breast feeding. She request Depo for birth control. History was taken with aid of Spanish Interpreter  Prenatal History/Complications:  Past Medical History: Past Medical History  Diagnosis Date  . Medical history non-contributory   . Headache     Past Surgical History: Past Surgical History  Procedure Laterality Date  . Appendectomy    . Cholecystectomy    . Cholecystectomy    . Appendectomy      Obstetrical History: OB History    Gravida Para Term Preterm AB TAB SAB Ectopic Multiple Living   5 2 2  2  2   2       Social History: History   Social History  . Marital Status: Single    Spouse Name: N/A  . Number of Children: N/A  . Years of Education: N/A   Social History Main Topics  . Smoking status: Never Smoker   . Smokeless tobacco: Never Used  . Alcohol Use: No  . Drug Use: No  . Sexual Activity: Yes    Birth Control/ Protection: None   Other Topics Concern  . None   Social History Narrative    Family History: Family History  Problem Relation Age of Onset  . Diabetes Mother   . Stroke Father   . Hearing loss Neg Hx     Allergies: No Known Allergies  Prescriptions prior to admission  Medication Sig Dispense Refill Last Dose  . Prenatal Vit-Fe Fumarate-FA (PRENATAL MULTIVITAMIN) TABS Take 1 tablet by mouth daily at 12 noon.   Past Week at Unknown time  . fluconazole (DIFLUCAN) 150 MG tablet   0 Not Taking   Review of Systems   All systems reviewed and negative except  as stated in HPI  Blood pressure 156/83, pulse 62, temperature 97.9 F (36.6 C), temperature source Oral, resp. rate 24, height 5\' 2"  (1.575 m), weight 200 lb (90.719 kg), last menstrual period 05/26/2014, unknown if currently breastfeeding. General appearance: alert, cooperative and no distress Lungs: No increased work of breathing noted. Heart: S1 and S2 noted. No murmurs/rubs/gallops. Regular rate and rhythm. Presentation: cephalic Fetal monitoringBaseline: 140 bpm, Variability: Fair (1-6 bpm), Accelerations: Reactive and Decelerations: Absent Uterine activityNone     Prenatal labs: ABO, Rh: O/POS/-- (12/16 1106) Antibody: NEG (12/16 1106) Rubella:   RPR: NON REAC (12/16 1106)  HBsAg: NEGATIVE (12/16 1106)  HIV: NONREACTIVE (12/16 1106)   Clinic [facility] Prenatal Labs  Dating LMP/27 week [place] Blood type: O/POS/-- (12/16 1106)O pos  Genetic Screen  Too late Antibody:NEG (12/16 1106)  Anatomic [place] Normal female, but incomplete. Needs F/U Rubella: 10.10 (12/16 1106)  GTT Third trimester: 100 RPR: NON REAC (12/16 1106)   TDaP vaccine 12/09/14 HBsAg: NEGATIVE (12/16 1106)   Flu vaccine  Declined HIV: NONREACTIVE (12/16 1106)   GBS  Negative GBS: NEGATIVE 02/03/15  Contraception  Depo UKG:URKYHC  Baby Food  Breast   Circumcision  Undecided   Pediatrician Hanna Person  Husband    Results for orders placed or performed during  the hospital encounter of 02/10/15 (from the past 24 hour(s))  CBC   Collection Time: 02/10/15 11:27 AM  Result Value Ref Range   WBC 9.8 4.0 - 10.5 K/uL   RBC 4.50 3.87 - 5.11 MIL/uL   Hemoglobin 12.2 12.0 - 15.0 g/dL   HCT 36.2 36.0 - 46.0 %   MCV 80.4 78.0 - 100.0 fL   MCH 27.1 26.0 - 34.0 pg   MCHC 33.7 30.0 - 36.0 g/dL   RDW 15.5 11.5 - 15.5 %   Platelets 141 (L) 150 - 400 K/uL  Results for orders placed or performed in visit on 02/10/15 (from the past 24 hour(s))  POCT urinalysis dip (device)   Collection Time: 02/10/15  8:13 AM   Result Value Ref Range   Glucose, UA NEGATIVE NEGATIVE mg/dL   Bilirubin Urine NEGATIVE NEGATIVE   Ketones, ur NEGATIVE NEGATIVE mg/dL   Specific Gravity, Urine 1.025 1.005 - 1.030   Hgb urine dipstick SMALL (A) NEGATIVE   pH 7.0 5.0 - 8.0   Protein, ur >=300 (A) NEGATIVE mg/dL   Urobilinogen, UA 0.2 0.0 - 1.0 mg/dL   Nitrite NEGATIVE NEGATIVE   Leukocytes, UA NEGATIVE NEGATIVE  Protein Creatinine Ratio- 8.19  Patient Active Problem List   Diagnosis Date Noted  . Pregnancy 02/10/2015  . Hypertension in pregnancy, preeclampsia 12/09/2014    Assessment: Desiree Marquez is a 39 y.o. [id number] at [redacted]w[redacted]d here for induction of labor following diagnosis of pre-eclampsia - Cytotec q4hr with cervical exams prior to each - Monitor BP. Will give magnesium if consistently above 160/110 - Follow up pre-eclampsia work-up: CBC, CMP. Protein Creatinine ratio not repeated (8.19 on 2/10) - Plans to breast feed - Plans for Depo for contraception - Undecided concerning circumcision  Lorna Few 02/10/2015, 12:53 PM   Attending: Harolyn Rutherford  Evaluation and management procedures were performed by Resident physician under my supervision/collaboration. Chart reviewed, patient examined by me and I agree with management and plan.

## 2015-02-10 NOTE — Progress Notes (Signed)
Desiree Marquez is a 39 y.o. [id number] at [redacted]w[redacted]d admitted for IOL indicated by preeclampsia.  Subjective: CTSP due to RN concern with EFM tracing. Comfortable.  H/A present, mild, improved from time of admission w/o treatment. No visual disturbance. No further CP.  PN course significant for AMA, fibroid (LUS 3x4cm subserosal), third tri bleeding.   Objective: BP 153/80 mmHg  Pulse 61  Temp(Src) 98.7 F (37.1 C) (Oral)  Resp 20  Ht 5\' 2"  (1.575 m)  Wt 90.719 kg (200 lb)  BMI 36.57 kg/m2  LMP 05/26/2014 (Exact Date) Filed Vitals:   02/10/15 1357 02/10/15 1401 02/10/15 1431 02/10/15 1531  BP: 148/87 141/100 138/72 153/80  Pulse: 64 66 62 61  Temp:    98.7 F (37.1 C)  TempSrc:    Oral  Resp:  20 18 20   Height:      Weight:       Results for orders placed or performed during the hospital encounter of 02/10/15 (from the past 24 hour(s))  CBC     Status: Abnormal   Collection Time: 02/10/15 11:27 AM  Result Value Ref Range   WBC 9.8 4.0 - 10.5 K/uL   RBC 4.50 3.87 - 5.11 MIL/uL   Hemoglobin 12.2 12.0 - 15.0 g/dL   HCT 36.2 36.0 - 46.0 %   MCV 80.4 78.0 - 100.0 fL   MCH 27.1 26.0 - 34.0 pg   MCHC 33.7 30.0 - 36.0 g/dL   RDW 15.5 11.5 - 15.5 %   Platelets 141 (L) 150 - 400 K/uL  Type and screen     Status: None   Collection Time: 02/10/15 11:27 AM  Result Value Ref Range   ABO/RH(D) O POS    Antibody Screen NEG    Sample Expiration 02/13/2015   Comprehensive metabolic panel     Status: Abnormal   Collection Time: 02/10/15  1:09 PM  Result Value Ref Range   Sodium 134 (L) 135 - 145 mmol/L   Potassium 3.5 3.5 - 5.1 mmol/L   Chloride 109 96 - 112 mmol/L   CO2 21 19 - 32 mmol/L   Glucose, Bld 77 70 - 99 mg/dL   BUN 14 6 - 23 mg/dL   Creatinine, Ser 0.56 0.50 - 1.10 mg/dL   Calcium 8.0 (L) 8.4 - 10.5 mg/dL   Total Protein 6.0 6.0 - 8.3 g/dL   Albumin 2.5 (L) 3.5 - 5.2 g/dL   AST 28 0 - 37 U/L   ALT 21 0 - 35 U/L   Alkaline Phosphatase 190 (H) 39 - 117 U/L   Total  Bilirubin 0.5 0.3 - 1.2 mg/dL   GFR calc non Af Amer >90 >90 mL/min   GFR calc Af Amer >90 >90 mL/min   Anion gap 4 (L) 5 - 15  Plt 262k 1 mo ago  Fetal Heart FHR: 150 bpm, variability: moderate,  accelerations:  Present,  decelerations:  Present 10bpm accels and late onset variables to 140 repetitively   Contractions: irregular q2-5 min S/P cytotec placement at 1400  SVE:   Dilation: 1 Effacement (%): 20 Station: -3 Exam by:: Desiree Marquez CNM   Posterior cx; head ballotable; scant bloody show  EXT: 2+ edema; DTRs 1+  EKG pending  Assessment / Plan:  39 yo G5P2022 at [redacted]w[redacted]d , LUS fibroid, 3rd tri bleeding; preE  Preeclampsia: stable except borderline thrombocytopenia, without severe features at present Labor: n/a. Cervical ripening in progress. Consider FB next exam Fetal Wellbeing: Category 2> IVF bolus 250cc; repositioned,  close observation Pain Control:  n/a Expected mode of delivery: NSVD Dr. Harolyn Marquez aware  Desiree Marquez,Desiree Marquez 02/10/2015, 3:35 PM

## 2015-02-10 NOTE — Progress Notes (Signed)
Pt thought she would be have to leave for a short while before being admitted, so NST was performed - reactive.  While pt having NST she determined that she could stay for direct admit now. Pt taken to MAU registration after NST completed. Interpreter - Elon Jester present for encounter today and pt voiced understanding of her medical condition and plan of care.

## 2015-02-11 ENCOUNTER — Encounter (HOSPITAL_COMMUNITY)
Admission: AD | Disposition: A | Discharge status: Home / Self Care | Payer: Self-pay | Source: Ambulatory Visit | Attending: Obstetrics and Gynecology

## 2015-02-11 ENCOUNTER — Inpatient Hospital Stay (HOSPITAL_COMMUNITY): Payer: Medicaid Other | Admitting: Anesthesiology

## 2015-02-11 ENCOUNTER — Encounter (HOSPITAL_COMMUNITY): Payer: Self-pay | Admitting: Anesthesiology

## 2015-02-11 DIAGNOSIS — Z3A37 37 weeks gestation of pregnancy: Secondary | ICD-10-CM

## 2015-02-11 DIAGNOSIS — O1403 Mild to moderate pre-eclampsia, third trimester: Secondary | ICD-10-CM

## 2015-02-11 LAB — CBC
HCT: 37.2 % (ref 36.0–46.0)
HCT: 30.4 % — ABNORMAL LOW (ref 36.0–46.0)
HEMOGLOBIN: 12.6 g/dL (ref 12.0–15.0)
HEMOGLOBIN: 10.3 g/dL — AB (ref 12.0–15.0)
MCH: 27 pg (ref 26.0–34.0)
MCH: 27.3 pg (ref 26.0–34.0)
MCHC: 33.9 g/dL (ref 30.0–36.0)
MCHC: 33.9 g/dL (ref 30.0–36.0)
MCV: 79.7 fL (ref 78.0–100.0)
MCV: 80.6 fL (ref 78.0–100.0)
PLATELETS: 124 10*3/uL — AB (ref 150–400)
Platelets: 134 10*3/uL — ABNORMAL LOW (ref 150–400)
RBC: 4.67 MIL/uL (ref 3.87–5.11)
RBC: 3.77 MIL/uL — AB (ref 3.87–5.11)
RDW: 15.7 % — ABNORMAL HIGH (ref 11.5–15.5)
RDW: 16 % — AB (ref 11.5–15.5)
WBC: 15.5 10*3/uL — ABNORMAL HIGH (ref 4.0–10.5)
WBC: 14.5 10*3/uL — AB (ref 4.0–10.5)

## 2015-02-11 LAB — RPR: RPR Ser Ql: NONREACTIVE

## 2015-02-11 LAB — HIV ANTIBODY (ROUTINE TESTING W REFLEX): HIV Screen 4th Generation wRfx: NONREACTIVE

## 2015-02-11 SURGERY — Surgical Case
Anesthesia: Spinal

## 2015-02-11 MED ORDER — ONDANSETRON HCL 4 MG/2ML IJ SOLN
INTRAMUSCULAR | Status: DC | PRN
Start: 2015-02-11 — End: 2015-02-11
  Administered 2015-02-11: 4 mg via INTRAVENOUS

## 2015-02-11 MED ORDER — NALBUPHINE HCL 10 MG/ML IJ SOLN: 5.0000 mg | INTRAMUSCULAR | Status: DC | PRN

## 2015-02-11 MED ORDER — LACTATED RINGERS IV SOLN
INTRAVENOUS | Status: DC
  Administered 2015-02-11: 01:00:00 via INTRAUTERINE

## 2015-02-11 MED ORDER — OXYCODONE-ACETAMINOPHEN 5-325 MG PO TABS
1.0000 | ORAL_TABLET | ORAL | Status: DC | PRN
Start: 2015-02-11 — End: 2015-02-14
  Administered 2015-02-13 – 2015-02-14 (×2): 1 via ORAL
  Filled 2015-02-11 (×3): qty 1

## 2015-02-11 MED ORDER — ZOLPIDEM TARTRATE 5 MG PO TABS: 5.0000 mg | ORAL_TABLET | Freq: Every evening | ORAL | Status: DC | PRN

## 2015-02-11 MED ORDER — PRENATAL MULTIVITAMIN CH
1.0000 | ORAL_TABLET | Freq: Every day | ORAL | Status: DC
  Administered 2015-02-11 – 2015-02-14 (×4): 1 via ORAL
  Filled 2015-02-11 (×4): qty 1

## 2015-02-11 MED ORDER — IBUPROFEN 600 MG PO TABS
600.0000 mg | ORAL_TABLET | Freq: Four times a day (QID) | ORAL | Status: DC
  Administered 2015-02-11 – 2015-02-14 (×13): 600 mg via ORAL
  Filled 2015-02-11 (×13): qty 1

## 2015-02-11 MED ORDER — MEPERIDINE HCL 25 MG/ML IJ SOLN: 6.2500 mg | INTRAMUSCULAR | Status: DC | PRN

## 2015-02-11 MED ORDER — DIPHENHYDRAMINE HCL 25 MG PO CAPS: 25.0000 mg | ORAL_CAPSULE | Freq: Four times a day (QID) | ORAL | Status: DC | PRN

## 2015-02-11 MED ORDER — DIBUCAINE 1 % RE OINT: 1.0000 "application " | TOPICAL_OINTMENT | RECTAL | Status: DC | PRN

## 2015-02-11 MED ORDER — SIMETHICONE 80 MG PO CHEW
80.0000 mg | CHEWABLE_TABLET | ORAL | Status: DC | PRN
  Filled 2015-02-11: qty 1

## 2015-02-11 MED ORDER — OXYTOCIN 10 UNIT/ML IJ SOLN
40.0000 [IU] | INTRAVENOUS | Status: DC | PRN
  Administered 2015-02-11: 40 [IU] via INTRAVENOUS

## 2015-02-11 MED ORDER — MORPHINE SULFATE (PF) 0.5 MG/ML IJ SOLN
INTRAMUSCULAR | Status: DC | PRN
  Administered 2015-02-11: .1 ug via INTRATHECAL

## 2015-02-11 MED ORDER — FENTANYL CITRATE 0.05 MG/ML IJ SOLN
INTRAMUSCULAR | Status: AC
  Filled 2015-02-11: qty 2

## 2015-02-11 MED ORDER — SENNOSIDES-DOCUSATE SODIUM 8.6-50 MG PO TABS
2.0000 | ORAL_TABLET | ORAL | Status: DC
  Administered 2015-02-11 – 2015-02-14 (×3): 2 via ORAL
  Filled 2015-02-11 (×4): qty 2

## 2015-02-11 MED ORDER — PHENYLEPHRINE HCL 10 MG/ML IJ SOLN
INTRAMUSCULAR | Status: DC | PRN
  Administered 2015-02-11: 40 ug via INTRAVENOUS
  Administered 2015-02-11: 50 ug via INTRAVENOUS

## 2015-02-11 MED ORDER — LACTATED RINGERS IV SOLN
INTRAVENOUS | Status: DC
  Administered 2015-02-11 (×2): via INTRAVENOUS

## 2015-02-11 MED ORDER — MORPHINE SULFATE 0.5 MG/ML IJ SOLN
INTRAMUSCULAR | Status: AC
  Filled 2015-02-11: qty 10

## 2015-02-11 MED ORDER — TERBUTALINE SULFATE 1 MG/ML IJ SOLN
0.2500 mg | Freq: Once | INTRAMUSCULAR | Status: AC
  Administered 2015-02-11: 0.25 mg via SUBCUTANEOUS

## 2015-02-11 MED ORDER — LANOLIN HYDROUS EX OINT: 1.0000 "application " | TOPICAL_OINTMENT | CUTANEOUS | Status: DC | PRN

## 2015-02-11 MED ORDER — DIPHENHYDRAMINE HCL 50 MG/ML IJ SOLN: 12.5000 mg | INTRAMUSCULAR | Status: DC | PRN

## 2015-02-11 MED ORDER — SIMETHICONE 80 MG PO CHEW
80.0000 mg | CHEWABLE_TABLET | ORAL | Status: DC
  Administered 2015-02-11 – 2015-02-14 (×3): 80 mg via ORAL
  Filled 2015-02-11 (×3): qty 1

## 2015-02-11 MED ORDER — SCOPOLAMINE 1 MG/3DAYS TD PT72
MEDICATED_PATCH | TRANSDERMAL | Status: AC
  Filled 2015-02-11: qty 1

## 2015-02-11 MED ORDER — FENTANYL CITRATE 0.05 MG/ML IJ SOLN
INTRAMUSCULAR | Status: DC | PRN
  Administered 2015-02-11: 25 ug via INTRATHECAL

## 2015-02-11 MED ORDER — PHENYLEPHRINE 8 MG IN D5W 100 ML (0.08MG/ML) PREMIX OPTIME
INJECTION | INTRAVENOUS | Status: AC
  Filled 2015-02-11: qty 100

## 2015-02-11 MED ORDER — SCOPOLAMINE 1 MG/3DAYS TD PT72
1.0000 | MEDICATED_PATCH | Freq: Once | TRANSDERMAL | Status: AC
  Administered 2015-02-11: 1.5 mg via TRANSDERMAL

## 2015-02-11 MED ORDER — TETANUS-DIPHTH-ACELL PERTUSSIS 5-2.5-18.5 LF-MCG/0.5 IM SUSP
0.5000 mL | Freq: Once | INTRAMUSCULAR | Status: DC
  Filled 2015-02-11: qty 0.5

## 2015-02-11 MED ORDER — OXYTOCIN 40 UNITS IN LACTATED RINGERS INFUSION - SIMPLE MED: 62.5000 mL/h | INTRAVENOUS | Status: AC

## 2015-02-11 MED ORDER — OXYCODONE-ACETAMINOPHEN 5-325 MG PO TABS
2.0000 | ORAL_TABLET | ORAL | Status: DC | PRN
Start: 2015-02-11 — End: 2015-02-14

## 2015-02-11 MED ORDER — DIPHENHYDRAMINE HCL 25 MG PO CAPS
25.0000 mg | ORAL_CAPSULE | ORAL | Status: DC | PRN
  Filled 2015-02-11: qty 1

## 2015-02-11 MED ORDER — FENTANYL CITRATE 0.05 MG/ML IJ SOLN: 25.0000 ug | INTRAMUSCULAR | Status: DC | PRN

## 2015-02-11 MED ORDER — ONDANSETRON HCL 4 MG PO TABS: 4.0000 mg | ORAL_TABLET | ORAL | Status: DC | PRN

## 2015-02-11 MED ORDER — NALBUPHINE HCL 10 MG/ML IJ SOLN: 5.0000 mg | Freq: Once | INTRAMUSCULAR | Status: AC | PRN

## 2015-02-11 MED ORDER — OXYTOCIN 10 UNIT/ML IJ SOLN
INTRAMUSCULAR | Status: AC
  Filled 2015-02-11: qty 4

## 2015-02-11 MED ORDER — WITCH HAZEL-GLYCERIN EX PADS: 1.0000 "application " | MEDICATED_PAD | CUTANEOUS | Status: DC | PRN

## 2015-02-11 MED ORDER — ONDANSETRON HCL 4 MG/2ML IJ SOLN: 4.0000 mg | INTRAMUSCULAR | Status: DC | PRN

## 2015-02-11 MED ORDER — SIMETHICONE 80 MG PO CHEW
80.0000 mg | CHEWABLE_TABLET | Freq: Three times a day (TID) | ORAL | Status: DC
  Administered 2015-02-11 – 2015-02-14 (×9): 80 mg via ORAL
  Filled 2015-02-11 (×9): qty 1

## 2015-02-11 MED ORDER — ONDANSETRON HCL 4 MG/2ML IJ SOLN: 4.0000 mg | Freq: Three times a day (TID) | INTRAMUSCULAR | Status: DC | PRN

## 2015-02-11 MED ORDER — FENTANYL CITRATE 0.05 MG/ML IJ SOLN: 100.0000 ug | INTRAMUSCULAR | Status: DC | PRN

## 2015-02-11 MED ORDER — CEFAZOLIN SODIUM-DEXTROSE 2-3 GM-% IV SOLR
INTRAVENOUS | Status: AC
  Filled 2015-02-11: qty 50

## 2015-02-11 MED ORDER — SODIUM CHLORIDE 0.9 % IJ SOLN: 3.0000 mL | INTRAMUSCULAR | Status: DC | PRN

## 2015-02-11 MED ORDER — MENTHOL 3 MG MT LOZG: 1.0000 | LOZENGE | OROMUCOSAL | Status: DC | PRN

## 2015-02-11 MED ORDER — DEXTROSE 5 % IV SOLN
1.0000 ug/kg/h | INTRAVENOUS | Status: DC | PRN
  Filled 2015-02-11: qty 2

## 2015-02-11 MED ORDER — ONDANSETRON HCL 4 MG/2ML IJ SOLN
INTRAMUSCULAR | Status: AC
  Filled 2015-02-11: qty 2

## 2015-02-11 MED ORDER — NALOXONE HCL 0.4 MG/ML IJ SOLN: 0.4000 mg | INTRAMUSCULAR | Status: DC | PRN

## 2015-02-11 SURGICAL SUPPLY — 34 items
APL SKNCLS STERI-STRIP NONHPOA (GAUZE/BANDAGES/DRESSINGS) ×1
BENZOIN TINCTURE PRP APPL 2/3 (GAUZE/BANDAGES/DRESSINGS) ×2 IMPLANT
CLAMP CORD UMBIL (MISCELLANEOUS) IMPLANT
CLOSURE WOUND 1/2 X4 (GAUZE/BANDAGES/DRESSINGS) ×1
CLOTH BEACON ORANGE TIMEOUT ST (SAFETY) ×3 IMPLANT
DRAPE SHEET LG 3/4 BI-LAMINATE (DRAPES) IMPLANT
DRSG OPSITE POSTOP 4X10 (GAUZE/BANDAGES/DRESSINGS) ×3 IMPLANT
DURAPREP 26ML APPLICATOR (WOUND CARE) ×3 IMPLANT
ELECT REM PT RETURN 9FT ADLT (ELECTROSURGICAL) ×3
ELECTRODE REM PT RTRN 9FT ADLT (ELECTROSURGICAL) ×1 IMPLANT
EXTRACTOR VACUUM KIWI (MISCELLANEOUS) IMPLANT
GLOVE BIO SURGEON ST LM GN SZ9 (GLOVE) ×3 IMPLANT
GLOVE BIOGEL PI IND STRL 9 (GLOVE) ×1 IMPLANT
GLOVE BIOGEL PI INDICATOR 9 (GLOVE) ×2
GOWN STRL REUS W/TWL 2XL LVL3 (GOWN DISPOSABLE) ×3 IMPLANT
GOWN STRL REUS W/TWL LRG LVL3 (GOWN DISPOSABLE) ×3 IMPLANT
NDL HYPO 25X5/8 SAFETYGLIDE (NEEDLE) IMPLANT
NEEDLE HYPO 25X5/8 SAFETYGLIDE (NEEDLE) IMPLANT
NS IRRIG 1000ML POUR BTL (IV SOLUTION) ×3 IMPLANT
PACK C SECTION WH (CUSTOM PROCEDURE TRAY) ×3 IMPLANT
PAD OB MATERNITY 4.3X12.25 (PERSONAL CARE ITEMS) ×3 IMPLANT
RTRCTR C-SECT PINK 25CM LRG (MISCELLANEOUS) IMPLANT
RTRCTR C-SECT PINK 34CM XLRG (MISCELLANEOUS) IMPLANT
STRIP CLOSURE SKIN 1/2X4 (GAUZE/BANDAGES/DRESSINGS) ×1 IMPLANT
SUT MNCRL 0 VIOLET CTX 36 (SUTURE) ×2 IMPLANT
SUT MONOCRYL 0 CTX 36 (SUTURE) ×4
SUT VIC AB 0 CT1 27 (SUTURE) ×3
SUT VIC AB 0 CT1 27XBRD ANBCTR (SUTURE) ×1 IMPLANT
SUT VIC AB 2-0 CT1 27 (SUTURE) ×3
SUT VIC AB 2-0 CT1 TAPERPNT 27 (SUTURE) ×1 IMPLANT
SUT VIC AB 4-0 KS 27 (SUTURE) ×3 IMPLANT
SYR BULB IRRIGATION 50ML (SYRINGE) IMPLANT
TOWEL OR 17X24 6PK STRL BLUE (TOWEL DISPOSABLE) ×3 IMPLANT
TRAY FOLEY CATH 14FR (SET/KITS/TRAYS/PACK) ×3 IMPLANT

## 2015-02-11 NOTE — Addendum Note (Signed)
Addendum  created 02/11/15 1051 by Flossie Dibble, CRNA   Modules edited: Notes Section   Notes Section:  File: [id number]

## 2015-02-11 NOTE — Anesthesia Procedure Notes (Signed)
Spinal Patient location during procedure: OR Preanesthetic Checklist Completed: patient identified, site marked, surgical consent, pre-op evaluation, timeout performed, IV checked, risks and benefits discussed and monitors and equipment checked Spinal Block Patient position: sitting Prep: DuraPrep Patient monitoring: heart rate, cardiac monitor, continuous pulse ox and blood pressure Approach: midline Location: L3-4 Injection technique: single-shot Needle Needle type: Sprotte  Needle gauge: 24 G Needle length: 9 cm Assessment Sensory level: T4 Additional Notes Spinal Dosage in OR  Bupivicaine ml       1.2 PFMS04   mcg        100 Fentanyl mcg            25

## 2015-02-11 NOTE — Progress Notes (Signed)
Patient ID: Desiree Marquez, female   DOB: 1976-02-06, 39 y.o.   MRN: [id number]  FHR now with late-onset variables with each ctx, most recent one with HR to nadir of 60 x 30sec despite amnioinfusion for approx 30 mins now. Cx remained unchanged approx 20 mins ago. IUPC had been placed at 2345 in order to better trace ctx, and then amnioinfusion started after a prolonged decel @ 0050 to 70 x 3 mins. FHR variability has remained moderate and scalp stim obtained during exams. Dr Glo Herring called to come and evaluate FHR due to increased variables.  Serita Grammes 02/11/2015 2:21 AM

## 2015-02-11 NOTE — Anesthesia Preprocedure Evaluation (Addendum)
Anesthesia Evaluation  Patient identified by MRN, date of birth, ID band Patient awake    Reviewed: Allergy & Precautions, H&P , Patient's Chart, lab work & pertinent test results  Airway Mallampati: II  TM Distance: >3 FB Neck ROM: full    Dental no notable dental hx.    Pulmonary  breath sounds clear to auscultation  Pulmonary exam normal       Cardiovascular Exercise Tolerance: Good hypertension, Rhythm:regular Rate:Normal     Neuro/Psych    GI/Hepatic   Endo/Other  Morbid obesity  Renal/GU      Musculoskeletal   Abdominal   Peds  Hematology   Anesthesia Other Findings PIH- Repeat Plts are 124K MO   Reproductive/Obstetrics                            Anesthesia Physical Anesthesia Plan  ASA: III and emergent  Anesthesia Plan: Spinal   Post-op Pain Management:    Induction:   Airway Management Planned:   Additional Equipment:   Intra-op Plan:   Post-operative Plan:   Informed Consent: I have reviewed the patients History and Physical, chart, labs and discussed the procedure including the risks, benefits and alternatives for the proposed anesthesia with the patient or authorized representative who has indicated his/her understanding and acceptance.   Dental Advisory Given  Plan Discussed with: CRNA  Anesthesia Plan Comments: (Lab work confirmed with CRNA in room. Platelets okay. Discussed spinal anesthetic, and patient consents to the procedure:  included risk of possible headache,backache, failed block, allergic reaction, and nerve injury. This patient was asked if she had any questions or concerns before the procedure started. )       Anesthesia Quick Evaluation

## 2015-02-11 NOTE — Progress Notes (Signed)
I ordered patient's lunch. [place]  Interpreter.

## 2015-02-11 NOTE — Progress Notes (Signed)
UR chart review completed.  

## 2015-02-11 NOTE — Brief Op Note (Signed)
02/10/2015 - 02/11/2015  3:09 AM  PATIENT:  Desiree Marquez  39 y.o. female  PRE-OPERATIVE DIAGNOSIS:  non reassuring fetal heart rate pregnancy 37 weeks 2 days severe preeclampsia  POST-OPERATIVE DIAGNOSIS:  non reassuring fetal heart rate tracing 37 weeks 2 days severe preeclampsia IUGR infant  PROCEDURE:  Procedure(s): CESAREAN SECTION (N/A), a low transverse cervical cesarean section  SURGEON:  Surgeon(s) and Role:    * Jonnie Kind, MD - Primary  PHYSICIAN ASSISTANT:   ASSISTANTS: none   ANESTHESIA:   spinal  EBL:  Total I/O In: 1335.8 [P.O.:640; I.V.:695.8] Out: 2025 [Urine:1525; Blood:500]  BLOOD ADMINISTERED:none  DRAINS: Urinary Catheter (Foley)   LOCAL MEDICATIONS USED:  NONE  SPECIMEN:  Source of Specimen:  Placenta to l pathology  DISPOSITION OF SPECIMEN:  PATHOLOGY  COUNTS:  YES  TOURNIQUET:  * No tourniquets in log *  DICTATION: .Dragon Dictation  PLAN OF CARE: Has admission already  PATIENT DISPOSITION:  PACU - hemodynamically stable.  Indications: Patient was induced due to blood pressure concerns required magnesium sulfate due to blood pressures 578-469 systolic, and Foley bulb cervical ripening that developed repetitive decelerations with each contraction there were late in position. Beat-to-beat para bili was never loss. IUPC was placed and amnioinfusion performed without difficulty but without correction of the distress repetitive decelerations. Findings included a small infant and a small placenta was very calcific. There was a nuchal cord 1  start of Pharmacological VTE agent (>24hrs) due to surgical blood loss or risk of bleeding: not applicable Details of procedure. Patient was taken to the operating room prepped and draped for lower abdominal surgery after spinal anesthesia introduced. Transverse incision was made, sharply dissected to the fascia which was opened transversely, and the peritoneal opening performed bluntly. Transverse  uterine incision was performed and fetal vertex delivered without difficulty. The infant was small placenta was small and calcific and was sent for pathology. Uterus was irrigated, closed in running locking 0 Monocryl first layer and continuous running 0 Monocryl second layer. Abdomen was irrigated. Peritoneum was closed with 2-0 Vicryl. Fascia was closed with 0 Vicryl. Subcutaneous tissues were reapproximated with2- 0 plain and subcuticular 4-0 Vicryl close the skin with 500 cc the sponge and needle counts correct.

## 2015-02-11 NOTE — Progress Notes (Signed)
Desiree Marquez is a 39 y.o. [id number] at [redacted]w[redacted]d by LMP admitted for induction of labor due to Hypertension. The patient had AROM earlier this pm, and has now had  FSE and IUPC placed. Unfortunately the baby has developed repetitive decelerations with each contraction, some down to 90. Beat to beat persists well. Cervix remains firm, now mid position, thick and minimally changed since foley came out. Cesarean section delivery is recommended. Labor is spontaneous at this time, no pitocin. Pt is counselled, and agrees to cesarean.  Subjective:   Objective: BP 140/70 mmHg  Pulse 68  Temp(Src) 98.3 F (36.8 C) (Axillary)  Resp 16  Ht 5\' 2"  (1.575 m)  Wt 90.719 kg (200 lb)  BMI 36.57 kg/m2  SpO2 78%  LMP 05/26/2014 (Exact Date)   Total I/O In: 1335.8 [P.O.:640; I.V.:695.8] Out: 1300 [Urine:1300]  FHT:  FHR: 140 bpm, variability: moderate,  accelerations:  Abscent,  decelerations:  Present lates UC:   regular, every 4 minutes SVE:   Dilation: 4 Effacement (%): Thick Station: -3 Exam by:: Dr Glo Herring  Labs: Lab Results  Component Value Date   WBC 9.8 02/10/2015   HGB 12.2 02/10/2015   HCT 36.2 02/10/2015   MCV 80.4 02/10/2015   PLT 141* 02/10/2015    Assessment / Plan: IOL for Htn, with fetal intolerance of labor. Labor: terbutalene administered. Preeclampsia:  no signs or symptoms of toxicity Fetal Wellbeing:  Category II Pain Control:  Labor support without medications I/D:  n/a Anticipated MOD:  cesarean recomended, accepted , twith translator used for informed consent.  Millena Callins V 02/11/2015, 2:04 AM

## 2015-02-11 NOTE — Op Note (Signed)
The the brief note for operative details

## 2015-02-11 NOTE — Lactation Note (Addendum)
This note was copied from the chart of [place]. Lactation Consultation Note  Patient Name: Desiree Marquez DGUYQ'I Date: 02/11/2015 Reason for consult: Initial assessment;Infant < 6lbs;Difficult latch Baby 12 hours of life. Set mom's DEBP up in room, had already provided teaching earlier with interpreter. Left [place] in room for supplementation. Discussed implementations with patient's RN, MaryBeth.  Maternal Data Has patient been taught Hand Expression?: Yes Does the patient have breastfeeding experience prior to this delivery?: Yes  Feeding Feeding Type: Formula Nipple Type: Slow - flow Length of feed: 0 min  LATCH Score/Interventions Latch: Too sleepy or reluctant, no latch achieved, no sucking elicited. Intervention(s): Skin to skin;Waking techniques Intervention(s): Adjust position;Assist with latch;Breast compression  Audible Swallowing: None Intervention(s): Skin to skin;Hand expression  Type of Nipple: Everted at rest and after stimulation  Comfort (Breast/Nipple): Soft / non-tender     Hold (Positioning): Assistance needed to correctly position infant at breast and maintain latch.  LATCH Score: 5  Lactation Tools Discussed/Used     Consult Status Consult Status: Follow-up Date: 02/12/15 Follow-up type: In-patient    Inocente Salles 02/11/2015, 2:45 PM

## 2015-02-11 NOTE — Lactation Note (Addendum)
This note was copied from the chart of [place]. Lactation Consultation Note  Patient Name: Boy Illana Nolting YWVPX'T Date: 02/11/2015 Reason for consult: Initial assessment;Infant < 6lbs;Difficult latch Baby 9 hours of life when seen by LC. Used [employer] interpreter, "Agusto" [phone], during [facility] visit. Mom states that she nursed her first child 48 days--quitting because she had surgery, and second child for 8 months. Mom reports that she wants to offer breast and bottle to this baby, and this was her plan on admission. Discussed supply and demand with mom. Assisted mom to latch baby in football position to right breast. Mom had been attempting to nurse baby in cradle position and then was not able to manage cross-cradle with tubing and wiring on her hands. Mom states she is comfortable with football position. Mom able to hand express colostrum prior to attempting to latch baby. Baby only suckled a couple of times at breast and then fell asleep. Assisted parents to supplement baby. Mom not able to get baby to take bottle so LC assisted. Baby did not have a coordinated suckle and was lethargic. Discussed with nursery technician and then central nursery nurse that baby difficult to feed with bottle, and only received 3 mls.   Enc parents to nurse baby first, then offer supplementation with each feeding. Demonstrated to FOB how to measure out supplementation amounts. Enc mom to continue to offer STS as well. Enc limited feeds to 30 minutes, and not over-stimulating baby due to baby's weight.  Parents given [place] brochure, aware of OP/BFSG, community resources, and [facility] phone line assistance after D/C.  Enc mom to start pumping with DEBP to protect milk supply since baby not at breast. Mom just received her lunch, so offered to come back after lunch and set her up and mom agreed. Mom states that she is familiar with pumping and has DEBP at home. Enc pumping for 15 minutes, every 3 hours.   Maternal  Data Has patient been taught Hand Expression?: Yes Does the patient have breastfeeding experience prior to this delivery?: Yes  Feeding Feeding Type: Formula Nipple Type: Slow - flow Length of feed: 0 min  LATCH Score/Interventions Latch: Too sleepy or reluctant, no latch achieved, no sucking elicited. Intervention(s): Skin to skin;Waking techniques Intervention(s): Adjust position;Assist with latch;Breast compression  Audible Swallowing: None Intervention(s): Skin to skin;Hand expression  Type of Nipple: Everted at rest and after stimulation  Comfort (Breast/Nipple): Soft / non-tender     Hold (Positioning): Assistance needed to correctly position infant at breast and maintain latch.  LATCH Score: 5  Lactation Tools Discussed/Used     Consult Status Consult Status: Follow-up Date: 02/12/15 Follow-up type: In-patient    Inocente Salles 02/11/2015, 1:56 PM

## 2015-02-11 NOTE — Progress Notes (Signed)
I assisted Olean Ree RN with some questions and I ordered patients, dinner, and breakfast, by Juliann Mule Sp[employer] Interpreter

## 2015-02-11 NOTE — Progress Notes (Signed)
Interpreter at bedside to discuss plan of care and perform an assessment with patient. All questions were answered.

## 2015-02-11 NOTE — Anesthesia Postprocedure Evaluation (Signed)
Anesthesia Post Note  Patient: Desiree Marquez  Procedure(s) Performed: Procedure(s) (LRB): CESAREAN SECTION (N/A)  Anesthesia type: Spinal  Patient location: Mother/Baby  Post pain: Pain level controlled  Post assessment: Post-op Vital signs reviewed  Last Vitals:  Filed Vitals:   02/11/15 1000  BP: 102/81  Pulse: 86  Temp:   Resp:     Post vital signs: Reviewed  Level of consciousness: awake  Complications: No apparent anesthesia complications

## 2015-02-11 NOTE — Anesthesia Postprocedure Evaluation (Signed)
  Anesthesia Post-op Note  Patient: Desiree Marquez  Procedure(s) Performed: Procedure(s): CESAREAN SECTION (N/A)  Patient is awake, responsive, moving her legs, and has signs of resolution of her numbness. Pain and nausea are reasonably well controlled. Vital signs are stable and clinically acceptable. Oxygen saturation is clinically acceptable. There are no apparent anesthetic complications at this time. Patient is ready for discharge.

## 2015-02-11 NOTE — Transfer of Care (Signed)
Immediate Anesthesia Transfer of Care Note  Patient: Desiree Marquez  Procedure(s) Performed: Procedure(s): CESAREAN SECTION (N/A)  Patient Location: PACU  Anesthesia Type:Spinal  Level of Consciousness: awake, alert  and oriented  Airway & Oxygen Therapy: Patient Spontanous Breathing  Post-op Assessment: Report given to RN and Post -op Vital signs reviewed and stable  Post vital signs: Reviewed and stable  Last Vitals:  Filed Vitals:   02/11/15 0147  BP:   Pulse: 68  Temp:   Resp:     Complications: No apparent anesthesia complications

## 2015-02-12 ENCOUNTER — Encounter (HOSPITAL_COMMUNITY): Payer: Self-pay | Admitting: Obstetrics and Gynecology

## 2015-02-12 MED ORDER — INFLUENZA VAC SPLIT QUAD 0.5 ML IM SUSY
0.5000 mL | PREFILLED_SYRINGE | Freq: Once | INTRAMUSCULAR | Status: AC
  Administered 2015-02-12: 0.5 mL via INTRAMUSCULAR
  Filled 2015-02-12: qty 0.5

## 2015-02-12 NOTE — Progress Notes (Signed)
Subjective: Postpartum Day 1: Cesarean Delivery Patient reports tolerating PO.  No HA or visual changes.    Objective: Vital signs in last 24 hours: Temp:  [97.8 F (36.6 C)-98.8 F (37.1 C)] 97.8 F (36.6 C) (02/19 0800) Pulse Rate:  [64-89] 73 (02/19 0900) Resp:  [14-18] 16 (02/19 0900) BP: (93-126)/(54-81) 124/66 mmHg (02/19 0900) SpO2:  [94 %-99 %] 98 % (02/19 0900) Weight:  [185 lb 8 oz (84.142 kg)] 185 lb 8 oz (84.142 kg) (02/19 0600)  Physical Exam:  General: alert and no distress Lochia: appropriate Uterine Fundus: firm Incision: healing well, dressing dry DVT Evaluation: No evidence of DVT seen on physical exam.   Recent Labs  02/11/15 0155 02/11/15 1140  HGB 12.6 10.3*  HCT 37.2 30.4*    Assessment/Plan: Status post Cesarean section. Doing well postoperatively.  Transfer to Mother baby Magnesium off HARRAWAY-SMITH, [place] 02/12/2015, 9:31 AM

## 2015-02-12 NOTE — Progress Notes (Signed)
I assisted Dr. Harraway-Smith with explanation of care plan. Eda H Royal Interpreter. °

## 2015-02-12 NOTE — Lactation Note (Signed)
This note was copied from the chart of [place]. Lactation Consultation Note       Patient Name: Desiree Marquez BWLSL'H Date: 02/12/2015 Reason for consult: Follow-up assessment with this mom of a now 76 hour old infant, 37 3/7 weeks CGA, weighing 4 lbs 9 oz. With the help of the Spanish interpreter, the parents were informed of the importance of supplementing their baby with EBM/formula after each breast feeding, and to limit breast feeding to 15 minutes, then offer EBM,then formula, and then have mom pump for 15 minutes in premie setting. Baby to be fed every 3 hours.  I had mom pump, and she expressed about 2-3 mls of milk, which will be fed at the baby's next feeding. I showed dad how to wash and reassemble the pump parts. I also showed the parents how to feed the baby side lying, and explained how this is safer and easier for the baby. I advised them to allow the baby to take the amount he wants - 10-20 mls for today. He took 12 for me. I also told them that as he slows with feeding, he may need to burp, and then will take more.  Parents very appreciative of my information and help, and receptive to teaching.    Maternal Data    Feeding Feeding Type: Formula Nipple Type: Regular Length of feed: 15 min  LATCH Score/Interventions Latch: Grasps breast easily, tongue down, lips flanged, rhythmical sucking.  Audible Swallowing: A few with stimulation Intervention(s): Skin to skin  Type of Nipple: Everted at rest and after stimulation  Comfort (Breast/Nipple): Soft / non-tender     Hold (Positioning): Assistance needed to correctly position infant at breast and maintain latch.  LATCH Score: 8  Lactation Tools Discussed/Used Pump Review: Setup, frequency, and cleaning;Milk Storage;Other (comment) (premie setting, hand expression) Initiated by:: bedside RN Date initiated:: 02/12/15   Consult Status Consult Status: Follow-up Date: 02/13/15 Follow-up type:  In-patient    Tonna Corner 02/12/2015, 6:57 PM

## 2015-02-12 NOTE — Progress Notes (Signed)
I ordered lunch for the patient. Desiree Marquez  Interpreter.

## 2015-02-13 MED ORDER — CLINDAMYCIN HCL 300 MG PO CAPS
300.0000 mg | ORAL_CAPSULE | Freq: Three times a day (TID) | ORAL | Status: DC
  Administered 2015-02-13 – 2015-02-14 (×4): 300 mg via ORAL
  Filled 2015-02-13 (×8): qty 1

## 2015-02-13 NOTE — Clinical Social Work Maternal (Signed)
Clinical Social Work Department PSYCHOSOCIAL ASSESSMENT - MATERNAL/CHILD 02/13/2015  Patient: Desiree Marquez,Desiree Marquez Account Number: [id number] Admit Date: 02/10/2015  Childs Name:  Desiree Marquez    Clinical Social Worker: Chrisanne Loose, LCSW Date/Time: 02/13/2015 11:13 AM  Date Referred: 02/13/2015  Referral source  CN    Referred reason  LPNC   Other referral source:   I: FAMILY / HOME ENVIRONMENT Child's legal guardian: PARENT  Guardian - Name Guardian - Age Guardian - Address  Desiree Marquez [address] [place], [place] [address]  Desiree Marquez  (same as above)   Other household support members/support persons Name Relationship DOB   DAUGHTER 8 years old   SON 6 years old   Other support:   II PSYCHOSOCIAL DATA Information Source: Patient Interview  Financial and Community Resources Employment:  Financial resources: Self Pay If Medicaid - County:  Other  Food Stamps  WIC   School / Grade:  Maternity Care Coordinator / Child Services Coordination / Early Interventions: Cultural issues impacting care:   III STRENGTHS Strengths  Adequate Resources  Home prepared for Child (including basic supplies)  Supportive family/friends   Strength comment:   IV RISK FACTORS AND CURRENT PROBLEMS Current Problem: YES  Risk Factor & Current Problem Patient Issue Family Issue Risk Factor / Current Problem Comment  Other - See comment Y N LPNC @ 28 weeks    V SOCIAL WORK ASSESSMENT SW met with pt to assess reason for LPNC @ 28 weeks. Pt explained that she attempted to establish PNC at the Health Department at approximately 3 months but was denied care. Pt told CSW that her pay stubs did not support the information listed in Health Department system so PNC was denied. She states that she kept trying but was not successful. SW inquired about illegal substance use &  she denied any history. CSW informed the pt about the drug testing policy & she verbalized an understanding. UDS is negative, meconium results pending. She has all the necessary supplies for the infant & seems appropriate at this time. FOB at the bedside & appears to be supportive. CSW will continue to monitor drug screen results & make a referral if needed.     VI SOCIAL WORK PLAN Social Work Plan  No Further Intervention Required / No Barriers to Discharge   Type of pt/family education:  If child protective services report - county:  If child protective services report - date:  Information/referral to community resources comment:  Other social work plan:         Clinical Social Work Department PSYCHOSOCIAL ASSESSMENT - MATERNAL/CHILD 02/13/2015  Patient:  Desiree Marquez, Desiree Marquez  Account Number:  [id number]  Admit Date:  02/10/2015  Ardine Eng Name:   Shari Prows [facility]    Clinical Social Worker:  Gerri Spore, LCSW   Date/Time:  02/13/2015 11:13 AM  Date Referred:  02/13/2015   Referral source  CN     Referred reason  [facility]   Other referral source:    I:  FAMILY / HOME ENVIRONMENT Child's legal guardian:  PARENT  Guardian - Name Guardian - Age Guardian - Address  Desiree Marquez [address] [place], [place] [address]  Desiree Marquez  (same as above)   Other household support members/support persons Name Relationship DOB   DAUGHTER 41 years old   SON 55 years old   Other support:    II  PSYCHOSOCIAL DATA Information Source:  Patient Interview  Occupational hygienist Employment:   Museum/gallery curator resources:  Self Pay If [place] / Grade:   Maternity Care Coordinator / Child Services Coordination / Early Interventions:  Cultural issues impacting care:    III  STRENGTHS Strengths  Adequate Resources  Home prepared for Child (including basic supplies)  Supportive family/friends   Strength comment:    IV  RISK FACTORS AND CURRENT PROBLEMS Current Problem:  YES   Risk Factor & Current Problem Patient Issue Family Issue Risk Factor / Current Problem Comment  Other - See comment Y N LPNC @ 28 weeks    V  SOCIAL WORK ASSESSMENT SW met with pt to assess reason for [facility] @ 28 weeks.  Pt explained that she attempted to establish [facility] at the Health Department at approximately 3 months but was denied care. Pt told CSW that her pay stubs did not support the information listed in Health Department system so [facility] was denied.  She states that she kept trying but was not successful.  SW inquired about illegal substance use & she denied any history.  CSW informed the  pt about the drug testing policy & she verbalized an understanding.  UDS is negative, meconium results pending.  She has all the necessary supplies for the infant & seems appropriate at this time.  FOB at the bedside & appears to be supportive. CSW will continue to monitor drug screen results & make a referral if needed.      VI SOCIAL WORK PLAN Social Work Plan  No Further Intervention Required / No Barriers to Discharge   Type of pt/family education:   If child protective services report - county:   If child protective services report - date:   Information/referral to community resources comment:   Other social work plan:

## 2015-02-13 NOTE — Lactation Note (Signed)
This note was copied from the chart of Desiree Marquez. Lactation Consultation Note Follow up visit at 74 hours of age.  Spanish interpreter at bedside.  Mom is changing diaper and then placed baby to breast, baby latched with ease.  Gulping heard through out 20 minute feedings.  Baby sleepy after about 20 minutes.  Encouraged mom to put phototherapy lights back on baby quickly and to offer supplementation of EBM after feedings.  Mom's breast are full and she reports they did soften after last pumping about 3 hours ago.  Encouraged mom to pre pump a few minutes to soften breast prior to latch and feed baby for about 15-20 minutes.   Baby should then be supplemented with EBM.  Discussed allowing baby only 1 hour to finish milk in a bottle that baby has already started on.  Suggested mom start with about 36mls to supplement baby and add as needed to not waste EBM.  Mom reports pumping one breast and then the other for about 1 hours.  Discussed pumping for 15 minutes on both breasts to save time.  Mom to call MBU RN about breast engorgement protocol including ice if she is not emptying her breast with pump or is uncomfortable tonight.  Mom has [place], she recently certified and may need to get a loaner pump at discharge.  Mom does not have a pump at home, but does report experience with older child.  Report give to [facility] RN.  MOm to call for assist as needed.     Patient Name: [name] LYHTM'B Date: 02/13/2015 Reason for consult: Follow-up assessment;Hyperbilirubinemia;Infant weight loss;Infant < 6lbs   Maternal Data    Feeding Feeding Type: Breast Fed Nipple Type: Slow - flow Length of feed: 20 min  LATCH Score/Interventions Latch: Grasps breast easily, tongue down, lips flanged, rhythmical sucking. Intervention(s): Breast massage;Breast compression  Audible Swallowing: Spontaneous and intermittent Intervention(s): Hand expression  Type of Nipple: Everted at rest and after  stimulation  Comfort (Breast/Nipple): Filling, red/small blisters or bruises, mild/mod discomfort  Problem noted: Filling  Hold (Positioning): No assistance needed to correctly position infant at breast.  LATCH Score: 9  Lactation Tools Discussed/Used WIC Program: Yes (mom reports recently enrolled has vouchers not used yet) Pump Review: Milk Storage   Consult Status Consult Status: Follow-up Date: 02/14/15 Follow-up type: In-patient    Kendell Bane Justine Null 02/13/2015, 8:17 PM

## 2015-02-13 NOTE — Progress Notes (Signed)
Subjective: Postpartum Day 2: Cesarean Delivery Patient reports incisional pain, tolerating PO, + flatus and no problems voiding. Red area above incision starting yesterday afternoon   Objective: Vital signs in last 24 hours: Temp:  [97.8 F (36.6 C)-98.8 F (37.1 C)] 98 F (36.7 C) (02/20 0620) Pulse Rate:  [64-73] 71 (02/20 0700) Resp:  [16-20] 20 (02/20 0620) BP: (101-136)/(59-77) 118/77 mmHg (02/20 0700) SpO2:  [98 %-99 %] 99 % (02/20 0300)  Physical Exam:  General: alert, cooperative and no distress Lochia: appropriate Uterine Fundus: firm Incision: no significant drainage, no significant erythema DVT Evaluation: No evidence of DVT seen on physical exam. No cords or calf tenderness. No significant calf/ankle edema.   Recent Labs  02/11/15 0155 02/11/15 1140  HGB 12.6 10.3*  HCT 37.2 30.4*    Assessment/Plan: Status post Cesarean section. Postoperative course complicated by abdominal erythema concerning for cellulitis vs. contact dermatitis  Received clindamycin x1, slight decrease in erythema but area not smaller today, still warm and tender, to be evaluated by Dr. Elonda Husky this am. Breast and bottle feeding. Wants depo for birth control.  Beverlyn Roux 02/13/2015, 7:37 AM

## 2015-02-14 MED ORDER — OXYCODONE-ACETAMINOPHEN 5-325 MG PO TABS: 2.0000 | ORAL_TABLET | ORAL | Status: DC | PRN

## 2015-02-14 MED ORDER — DIPHENHYDRAMINE HCL 25 MG PO CAPS
25.0000 mg | ORAL_CAPSULE | Freq: Once | ORAL | Status: AC
  Administered 2015-02-14: 25 mg via ORAL
  Filled 2015-02-14: qty 1

## 2015-02-14 MED ORDER — IBUPROFEN 600 MG PO TABS: 600.0000 mg | ORAL_TABLET | Freq: Four times a day (QID) | ORAL | Status: DC

## 2015-02-14 MED ORDER — CLINDAMYCIN HCL 300 MG PO CAPS: 300.0000 mg | ORAL_CAPSULE | Freq: Three times a day (TID) | ORAL | Status: DC

## 2015-02-14 MED ORDER — PREDNISONE 50 MG PO TABS: 50.0000 mg | ORAL_TABLET | Freq: Every day | ORAL | Status: DC

## 2015-02-14 MED ORDER — PREDNISONE 50 MG PO TABS
50.0000 mg | ORAL_TABLET | Freq: Every day | ORAL | Status: DC
  Administered 2015-02-14: 50 mg via ORAL
  Filled 2015-02-14 (×2): qty 1

## 2015-02-14 NOTE — Progress Notes (Signed)
Assisted RN Lactation with interpretation of breast feeding instructions.  Spanish interpreter

## 2015-02-14 NOTE — Progress Notes (Signed)
Subjective: Postpartum Day 3: Cesarean Delivery Patient reports incisional pain, tolerating PO, + flatus and no problems voiding. Red area above incision starting afternoon of 2/19  Objective: Vital signs in last 24 hours: Temp:  [98.2 F (36.8 C)-98.4 F (36.9 C)] 98.4 F (36.9 C) (02/21 0456) Pulse Rate:  [70-102] 70 (02/21 0456) Resp:  [18] 18 (02/21 0456) BP: (118-148)/(77-87) 132/77 mmHg (02/21 0456)  Physical Exam:  General: alert, cooperative and no distress Lochia: appropriate Uterine Fundus: firm Incision: no significant drainage, no significant erythema DVT Evaluation: No evidence of DVT seen on physical exam. No cords or calf tenderness. No significant calf/ankle edema. Skin: Red area on abdomen not extending beyond marked area but not smaller and now with scattered satellite lesions around umbilicus   Recent Labs  02/11/15 1140  HGB 10.3*  HCT 30.4*    Assessment/Plan: Status post Cesarean section. Postoperative course complicated by abdominal erythema concerning for cellulitis Continue clindamycin, will discuss possible rx change with team. Breast and bottle feeding. Wants depo for birth control.  Desiree Marquez 02/14/2015, 7:46 AM  OB fellow attestation Post Partum Day 3 I have seen and examined this patient and agree with above documentation in the resident's note.  See discharge summary for further information.  Merla Riches, MD

## 2015-02-14 NOTE — Progress Notes (Signed)
Red area to left lower abdominal quadrant. Redness extending past marked area. Small red spots surrounding umbilicus and to right side of abdomen. Redness is warm to touch and tender. Pt states with interpreter that area is itchy and hurts. MD aware.

## 2015-02-14 NOTE — Progress Notes (Signed)
Assisted RN with patient questions concerning feeding and output.  Spanish Interpreter

## 2015-02-14 NOTE — Discharge Summary (Signed)
Obstetric Discharge Summary Reason for Admission: induction of labor Prenatal Procedures: Preeclampsia Intrapartum Procedures: cesarean: low cervical, transverse Postpartum Procedures: none Complications-Operative and Postpartum: abdominal cellulitis HEMOGLOBIN  Date Value Ref Range Status  02/11/2015 10.3* 12.0 - 15.0 g/dL Final    Comment:    REPEATED TO VERIFY DELTA CHECK NOTED    HCT  Date Value Ref Range Status  02/11/2015 30.4* 36.0 - 46.0 % Final    Physical Exam:  General: alert, cooperative and no distress Lochia: appropriate Uterine Fundus: firm Incision: no significant drainage, no significant erythema DVT Evaluation: No evidence of DVT seen on physical exam. No cords or calf tenderness. No significant calf/ankle edema. Skin: Red area on abdomen not extending beyond marked area but not smaller and now with scattered satellite lesions around umbilicus  Discharge Diagnoses: Term Pregnancy-delivered and cellulitis  Discharge Information: Date: 02/14/2015 Activity: pelvic rest Diet: routine Medications: Ibuprofen and clindamycin Condition: stable Instructions: refer to practice specific booklet Discharge to: home Follow-up Information    Follow up with [name]. Schedule an appointment as soon as possible for a visit in 1 week.   Contact information:   Desiree Marquez [phone]      Newborn Data: Live born female  Birth Weight: 4 lb 12.2 oz (2160 g) APGAR: 8, 9  Home with mother.  Desiree Marquez 02/14/2015, 8:44 AM

## 2015-02-14 NOTE — Discharge Instructions (Signed)
Cuidados en el postparto luego de un parto por cesrea  (Postpartum Care After Cesarean Delivery) Despus del parto (perodo de postparto), la estada normal en el hospital es de 24-72 horas. Si hubo problemas con el trabajo de parto o el parto, o si tiene otros problemas mdicos, es posible que Patent attorney en el hospital por ms [place].  Mientras est en el hospital, recibir [place] e instrucciones sobre cmo cuidar de usted misma y de su beb recin nacido durante el postparto.  Mientras est en el hospital:   Es normal que sienta dolor o molestias en la incisin en el abdomen. Asegrese de decirle a las enfermeras si Tree surgeon, as como donde Designer, television/film set y Architect.  Si est amamantando, puede sentir contracciones dolorosas en el tero durante algunas semanas. Esto es normal. Las contracciones ayudan a que el tero vuelva a su tamao normal.  Es normal tener algo de sangrado despus del [place].  Durante los primeros 1-3 das despus del parto, el flujo es de color rojo y la cantidad puede ser similar a un perodo.  Es frecuente que el flujo se inicie y se Production assistant, radio.  En los primeros [place], puede eliminar algunos cogulos pequeos. Informe a las enfermeras si comienza a eliminar cogulos grandes o aumenta el flujo.  No  elimine los cogulos de sangre por el inodoro antes de que la [employer] vea.  Durante los prximos 3 a [address] despus del parto, el flujo debe ser ms acuoso y rosado o Forensic psychologist.  Chancy Hurter a catorce [employer] del parto, el flujo debe ser una pequea cantidad de secrecin de color blanco amarillento.  La cantidad de flujo disminuir en las primeras semanas despus del parto. El flujo puede detenerse en 6-8 semanas. La mayora de las mujeres no tienen ms flujo a las 12 semanas despus del [place].  Usted debe cambiar sus apsitos con frecuencia.  Lvese bien las manos con agua y jabn durante al menos 20 segundos despus de cambiar el apsito, usar el  bao o antes de Nature conservation officer o Research scientist (life sciences) a su recin nacido.  Se le quitar la va intravenosa (IV) cuando ya est bebiendo suficientes lquidos.  El tubo de drenaje para la orina (catter urinario) que se inserta antes del parto puede ser retirado [employer] de 6-8 horas despus del parto o cuando las piernas vuelvan a Best boy sensibilidad. Usted puede sentir que tiene que vaciar la vejiga durante las primeras 6-8 horas despus de que le quiten el Holiday representative.  Si se siente dbil, mareada o se desmaya, llame a su enfermera antes de levantarse de la cama por primera vez y antes de tomar una ducha por primera vez.  En los primeros das despus del parto, podr sentir las mamas sensibles y [place]. Esto se llama congestin. La sensibilidad en los senos por lo general desaparece dentro de las 48-72 horas despus de que ocurre la congestin. Tambin puede notar que la [place] se escapa de sus senos. Si no est amamantando no estimule sus pechos. La estimulacin de las mamas hace que sus senos produzcan ms [place].  Pasar tanto tiempo como sea posible con el beb recin nacido es muy importante. Durante este [place], usted y su beb deben sentirse cerca y conocerse uno al otro. Tener al beb en su habitacin (alojamiento conjunto) ayudar a fortalecer el vnculo con el beb recin nacido. Esto le dar tiempo para conocerlo y atenderlo de [place] cmoda.  Las hormonas se modifican despus del parto. A  veces, los cambios hormonales pueden causar tristeza o ganas de llorar por un tiempo. Estos sentimientos no deben durar ms de [employer]. Si duran ms que eso, debe hablar con su mdico.  Si lo desea, hable con su mdico acerca de los mtodos de planificacin familiar o mtodos anticonceptivos.  Hable con su mdico acerca de las vacunas. El mdico puede indicarle que se aplique las siguientes vacunas antes de salir del hospital:  [place] contra el ttanos, la difteria y la tos ferina (Tdap) o el ttanos y la difteria (Td).  Es muy importante que usted y su familia (incluyendo a los abuelos) u otras personas que cuidan al recin nacido estn al da con las vacunas Tdap o Td. Las vacunas Tdap o Td pueden ayudar a proteger al recin nacido de enfermedades.  Inmunizacin contra la rubola.  Inmunizacin contra la varicela.  Inmunizacin contra la gripe. Usted debe recibir esta vacunacin anual si no la ha recibido Solicitor. Document Released: 11/27/2012 [facility] Patient Information 2015 [place]. This information is not intended to replace advice given to you by your health care provider. Make sure you discuss any questions you have with your health care provider.

## 2015-02-14 NOTE — Progress Notes (Signed)
Checked on patient and ordered dinner and breakfast Spanish Interpreter

## 2015-02-14 NOTE — Lactation Note (Signed)
This note was copied from the chart of [place]. Lactation Consultation Note  Patient Name: Desiree Marquez [id number] Date: 02/14/2015 Reason for consult: Follow-up assessment  With this mom and baby, now 48 hours old, and weight increasing now, up to 4 lbs 7.6 oz. . I encouraged mom to continue beast feeding the baby today, as long as he actively is feeding, and to supplement with EBm every 3 hours. Mom is able to independently latch the baby, and audible swallows and strong, slow pulls noted, with breast softening. Teaching done with Spanish interpreter today. I will fax information to [facility] for mom, so she can get a DEP on her dishcharge to home. Mom has an abdominal incision rash, possible cellulitis, so she may not be discharged today.    Maternal Data    Feeding Feeding Type: Breast Milk Nipple Type: Slow - flow Length of feed: 20 min (2 10 minute feedings about 10 minutes apart)  LATCH Score/Interventions Latch: Grasps breast easily, tongue down, lips flanged, rhythmical sucking. Intervention(s): Skin to skin Intervention(s): Assist with latch;Breast massage  Audible Swallowing: Spontaneous and intermittent  Type of Nipple: Everted at rest and after stimulation (slightly pinched after feeding - baby small but latched fairly deep today - breast softened after feeding)  Comfort (Breast/Nipple): Engorged, cracked, bleeding, large blisters, severe discomfort Problem noted: Engorgment Intervention(s): Ice  Problem noted: Filling;Mild/Moderate discomfort Interventions (Filling): Double electric pump  Hold (Positioning): No assistance needed to correctly position infant at breast. Intervention(s): Breastfeeding basics reviewed;Support Pillows;Position options;Skin to skin  LATCH Score: 8  Lactation Tools Discussed/Used     Consult Status Consult Status: Follow-up Date: 02/15/15 Follow-up type: In-patient    Tonna Corner 02/14/2015, 9:02 AM

## 2015-02-22 ENCOUNTER — Ambulatory Visit (INDEPENDENT_AMBULATORY_CARE_PROVIDER_SITE_OTHER): Payer: Self-pay

## 2015-02-22 VITALS — BP 127/75 | HR 71 | Temp 97.4°F | Wt 178.5 lb

## 2015-02-22 DIAGNOSIS — Z4889 Encounter for other specified surgical aftercare: Secondary | ICD-10-CM

## 2015-02-22 NOTE — Progress Notes (Signed)
Patient here today for wound check s/p C-section 02/11/15. Patient denies any pain, drainage or problems with incision site. Afebrile. Upon inspection, incision site appears to be healing very well. Incision is clean and dry-- wound edges approximated and intact-- steri strips in place. No redness or drainage present. Patient denies tenderness upon palpation. Advised patient continue to keep incision clean and dry and to inspect it daily for any signs of infection-- redness, purulent drainage, tenderness, fever etc. Advised that she call clinic or come to MAU should this occur. Patient verbalized understanding and gratitude. No questions or concerns. To return 03/24/15 for PP visit.

## 2015-02-26 ENCOUNTER — Inpatient Hospital Stay (HOSPITAL_COMMUNITY)
Admission: AD | Admit: 2015-02-26 | Discharge: 2015-02-26 | Disposition: A | Discharge status: Home / Self Care | Payer: Self-pay | Source: Ambulatory Visit | Attending: Obstetrics & Gynecology | Admitting: Obstetrics & Gynecology

## 2015-02-26 DIAGNOSIS — O9853 Other viral diseases complicating the puerperium: Secondary | ICD-10-CM | POA: Insufficient documentation

## 2015-02-26 DIAGNOSIS — B029 Zoster without complications: Secondary | ICD-10-CM | POA: Insufficient documentation

## 2015-02-26 LAB — CBC WITH DIFFERENTIAL/PLATELET
BASOS ABS: 0 10*3/uL (ref 0.0–0.1)
Basophils Relative: 0 % (ref 0–1)
Eosinophils Absolute: 0.5 10*3/uL (ref 0.0–0.7)
Eosinophils Relative: 7 % — ABNORMAL HIGH (ref 0–5)
HCT: 36.5 % (ref 36.0–46.0)
Hemoglobin: 12.1 g/dL (ref 12.0–15.0)
Lymphocytes Relative: 24 % (ref 12–46)
Lymphs Abs: 1.7 10*3/uL (ref 0.7–4.0)
MCH: 27.4 pg (ref 26.0–34.0)
MCHC: 33.2 g/dL (ref 30.0–36.0)
MCV: 82.8 fL (ref 78.0–100.0)
Monocytes Absolute: 0.3 10*3/uL (ref 0.1–1.0)
Monocytes Relative: 4 % (ref 3–12)
NEUTROS ABS: 4.6 10*3/uL (ref 1.7–7.7)
NEUTROS PCT: 65 % (ref 43–77)
PLATELETS: 311 10*3/uL (ref 150–400)
RBC: 4.41 MIL/uL (ref 3.87–5.11)
RDW: 15.9 % — AB (ref 11.5–15.5)
WBC: 7.1 10*3/uL (ref 4.0–10.5)

## 2015-02-26 LAB — PROTEIN / CREATININE RATIO, URINE
CREATININE, URINE: 299 mg/dL
PROTEIN CREATININE RATIO: 0.43 — AB (ref 0.00–0.15)
Total Protein, Urine: 129 mg/dL

## 2015-02-26 LAB — URINE MICROSCOPIC-ADD ON

## 2015-02-26 LAB — COMPREHENSIVE METABOLIC PANEL
ALBUMIN: 3.7 g/dL (ref 3.5–5.2)
ALK PHOS: 92 U/L (ref 39–117)
ALT: 23 U/L (ref 0–35)
ANION GAP: 10 (ref 5–15)
AST: 23 U/L (ref 0–37)
BUN: 14 mg/dL (ref 6–23)
CHLORIDE: 105 mmol/L (ref 96–112)
CO2: 25 mmol/L (ref 19–32)
CREATININE: 0.85 mg/dL (ref 0.50–1.10)
Calcium: 8.9 mg/dL (ref 8.4–10.5)
GFR calc Af Amer: >90 mL/min (ref >90)
GFR calc non Af Amer: 85 mL/min — ABNORMAL LOW (ref >90)
GLUCOSE: 98 mg/dL (ref 70–99)
Potassium: 3.7 mmol/L (ref 3.5–5.1)
Sodium: 140 mmol/L (ref 135–145)
Total Bilirubin: 0.6 mg/dL (ref 0.3–1.2)
Total Protein: 7 g/dL (ref 6.0–8.3)

## 2015-02-26 LAB — URINALYSIS, ROUTINE W REFLEX MICROSCOPIC
Bilirubin Urine: NEGATIVE
Glucose, UA: NEGATIVE mg/dL
Ketones, ur: 15 mg/dL — AB
Leukocytes, UA: NEGATIVE
Nitrite: NEGATIVE
PROTEIN: 100 mg/dL — AB
Specific Gravity, Urine: >1.03 — ABNORMAL HIGH (ref 1.005–1.030)
UROBILINOGEN UA: 0.2 mg/dL (ref 0.0–1.0)
pH: 6 (ref 5.0–8.0)

## 2015-02-26 MED ORDER — HYDROXYZINE PAMOATE 100 MG PO CAPS: 100.0000 mg | ORAL_CAPSULE | Freq: Three times a day (TID) | ORAL | Status: DC | PRN

## 2015-02-26 MED ORDER — HYDROXYZINE HCL 25 MG PO TABS: 25.0000 mg | ORAL_TABLET | Freq: Once | ORAL | Status: DC

## 2015-02-26 MED ORDER — ACYCLOVIR 400 MG PO TABS: 800.0000 mg | ORAL_TABLET | Freq: Every day | ORAL | Status: DC

## 2015-02-26 MED ORDER — HYDROXYZINE HCL 25 MG PO TABS
25.0000 mg | ORAL_TABLET | Freq: Once | ORAL | Status: DC
  Filled 2015-02-26: qty 1

## 2015-02-26 MED ORDER — ACYCLOVIR 400 MG PO TABS: 800.0000 mg | ORAL_TABLET | Freq: Every day | ORAL | Status: AC

## 2015-02-26 NOTE — MAU Note (Signed)
C/S on 2/18, pt states she developed a rash around her incision - was given medication to help with the rash while in the hospital, has also been taking this at home.  Rash has become worse - rash is over entire abdomen, arms, chest, thighs.  Pt states she has no issues with her incision, still bleeding - has changed 3 pads this a.m.

## 2015-02-26 NOTE — MAU Note (Signed)
Pt. Here due to rash on her abdomen, arms, chest and thighs. Did have this rash on her abdomen after her c-section on 2/18. Pt. States she has been taking Clindamycin daily. Also, was on Prednisone which she has finished.

## 2015-02-26 NOTE — Progress Notes (Signed)
I assisted Almyra Free PA with explanation of care plan. [place]  Interpreter.

## 2015-02-26 NOTE — MAU Provider Note (Signed)
History     CSN: [id number]  Arrival date and time: 02/26/15 1144   First Provider Initiated Contact with Patient 02/26/15 1248      Chief Complaint  Patient presents with  . Rash  . Hypertension   HPI [name] is a 39 y.o. [id number] who delivered by C/S on 02/11/15 who presents to MAU today with complaint of a painful, pruritic rash. The patient states that this became after her surgery and has worsened. Initially it was diagnosed as cellulitis and she was started on Clindamycin. She was also given Prednisone which she completed. She also has high blood pressure today. She states a history of elevated BP towards the end of her pregnancy. She denies RUQ pain, headache, blurred vision, floaters or significant peripheral edema.   OB History    Gravida Para Term Preterm AB TAB SAB Ectopic Multiple Living   5 3 3  2  2   0 3      Past Medical History  Diagnosis Date  . Medical history non-contributory   . Headache     Past Surgical History  Procedure Laterality Date  . Appendectomy    . Cholecystectomy    . Cholecystectomy    . Appendectomy    . Cesarean section N/A 02/11/2015    Procedure: CESAREAN SECTION;  Surgeon: Jonnie Kind, MD;  Location: [facility];  Service: Obstetrics;  Laterality: N/A;    Family History  Problem Relation Age of Onset  . Diabetes Mother   . Stroke Father   . Hearing loss Neg Hx     History  Substance Use Topics  . Smoking status: Never Smoker   . Smokeless tobacco: Never Used  . Alcohol Use: No    Allergies: No Known Allergies  Prescriptions prior to admission  Medication Sig Dispense Refill Last Dose  . clindamycin (CLEOCIN) 300 MG capsule Take 300 mg by mouth 3 (three) times daily.   02/26/2015 at Unknown time  . predniSONE (DELTASONE) 50 MG tablet Take 1 tablet (50 mg total) by mouth daily with breakfast. 7 tablet 0 Past Month at Unknown time  . Prenatal Vit-Fe Fumarate-FA (PRENATAL MULTIVITAMIN) TABS Take 1 tablet by mouth  daily at 12 noon.   02/26/2015 at Unknown time  . ibuprofen (ADVIL,MOTRIN) 600 MG tablet Take 1 tablet (600 mg total) by mouth every 6 (six) hours. 30 tablet 0 Unknown at Unknown time  . oxyCODONE-acetaminophen (PERCOCET/ROXICET) 5-325 MG per tablet Take 2 tablets by mouth every 4 (four) hours as needed (for pain scale equal to or greater than 7). 40 tablet 0     Review of Systems  Constitutional: Negative for fever.  Eyes: Negative for blurred vision.  Cardiovascular: Negative for leg swelling.  Gastrointestinal: Negative for abdominal pain.  Skin: Positive for rash.  Neurological: Negative for headaches.   Physical Exam   Blood pressure 148/86, pulse 86, temperature 98 F (36.7 C), temperature source Oral, last menstrual period 05/26/2014, unknown if currently breastfeeding.  Physical Exam  Constitutional: She is oriented to person, place, and time. She appears well-developed and well-nourished. No distress.  HENT:  Head: Normocephalic.  Cardiovascular: Normal rate.   Respiratory: Effort normal.  GI: Soft. She exhibits no distension and no mass. There is no tenderness. There is no rebound and no guarding.  Musculoskeletal: She exhibits edema (mild non-pitting edema noted bilaterally).  Neurological: She is alert and oriented to person, place, and time. She has normal reflexes.  1 beat clonus on the right  Skin: Skin is warm and dry. Rash (raised erythematous macular rash noted most prominent on lower abdomen with small areas also on the arms bilaterally) noted. No erythema.  Psychiatric: She has a normal mood and affect.   No results found for this or any previous visit (from the past 24 hour(s)).   Patient Vitals for the past 24 hrs:  BP Temp Temp src Pulse  02/26/15 1300 148/86 mmHg - - 86  02/26/15 1245 151/98 mmHg - - -  02/26/15 1212 (!) 161/107 mmHg 98 F (36.7 C) Oral 88    MAU Course  Procedures None  MDM UA, Urine protein/creatinine ratio, CBC and CMP  ordered Dr. Roselie Awkward and Dr. Deniece Ree to MAU to evaluate rash.  1330 - Care turned over to Dr. Deniece Ree. Labs pending.   Desiree Redden, PA-C  02/26/2015, 1:31 PM  Assessment and Plan     Care taken over, left abdomen with large macule at least 6cm x 14cm, dark papular echymosis centrally, spreads across umbilicus with small vesicles, also on bilateral arms, breast and appears to be starting on face.  Vesicle on left arm ruptured and cultured.   Labs: Results for orders placed or performed during the hospital encounter of 02/26/15 (from the past 24 hour(s))  Urinalysis, Routine w reflex microscopic   Collection Time: 02/26/15 12:19 PM  Result Value Ref Range   Color, Urine YELLOW YELLOW   APPearance CLEAR CLEAR   Specific Gravity, Urine >1.030 (H) 1.005 - 1.030   pH 6.0 5.0 - 8.0   Glucose, UA NEGATIVE NEGATIVE mg/dL   Hgb urine dipstick TRACE (A) NEGATIVE   Bilirubin Urine NEGATIVE NEGATIVE   Ketones, ur 15 (A) NEGATIVE mg/dL   Protein, ur 100 (A) NEGATIVE mg/dL   Urobilinogen, UA 0.2 0.0 - 1.0 mg/dL   Nitrite NEGATIVE NEGATIVE   Leukocytes, UA NEGATIVE NEGATIVE  Urine microscopic-add on   Collection Time: 02/26/15 12:19 PM  Result Value Ref Range   Squamous Epithelial / LPF RARE RARE   WBC, UA 0-2 <3 WBC/hpf   RBC / HPF 3-6 <3 RBC/hpf   Bacteria, UA RARE RARE  CBC with Differential/Platelet   Collection Time: 02/26/15  1:00 PM  Result Value Ref Range   WBC 7.1 4.0 - 10.5 K/uL   RBC 4.41 3.87 - 5.11 MIL/uL   Hemoglobin 12.1 12.0 - 15.0 g/dL   HCT 36.5 36.0 - 46.0 %   MCV 82.8 78.0 - 100.0 fL   MCH 27.4 26.0 - 34.0 pg   MCHC 33.2 30.0 - 36.0 g/dL   RDW 15.9 (H) 11.5 - 15.5 %   Platelets 311 150 - 400 K/uL   Neutrophils Relative % 65 43 - 77 %   Neutro Abs 4.6 1.7 - 7.7 K/uL   Lymphocytes Relative 24 12 - 46 %   Lymphs Abs 1.7 0.7 - 4.0 K/uL   Monocytes Relative 4 3 - 12 %   Monocytes Absolute 0.3 0.1 - 1.0 K/uL   Eosinophils Relative 7 (H) 0 - 5 %   Eosinophils  Absolute 0.5 0.0 - 0.7 K/uL   Basophils Relative 0 0 - 1 %   Basophils Absolute 0.0 0.0 - 0.1 K/uL  Comprehensive metabolic panel   Collection Time: 02/26/15  1:00 PM  Result Value Ref Range   Sodium 140 135 - 145 mmol/L   Potassium 3.7 3.5 - 5.1 mmol/L   Chloride 105 96 - 112 mmol/L   CO2 25 19 - 32 mmol/L   Glucose,  Bld 98 70 - 99 mg/dL   BUN 14 6 - 23 mg/dL   Creatinine, Ser 0.85 0.50 - 1.10 mg/dL   Calcium 8.9 8.4 - 10.5 mg/dL   Total Protein 7.0 6.0 - 8.3 g/dL   Albumin 3.7 3.5 - 5.2 g/dL   AST 23 0 - 37 U/L   ALT 23 0 - 35 U/L   Alkaline Phosphatase 92 39 - 117 U/L   Total Bilirubin 0.6 0.3 - 1.2 mg/dL   GFR calc non Af Amer 85 (L) >90 mL/min   GFR calc Af Amer >90 >90 mL/min   Anion gap 10 5 - 15  Protein / creatinine ratio, urine   Collection Time: 02/26/15  1:03 PM  Result Value Ref Range   Creatinine, Urine 299.00 mg/dL   Total Protein, Urine 129 mg/dL   Protein Creatinine Ratio 0.43 (H) 0.00 - 0.15  (decreased from 8.10)  Imaging Studies:  No results found.  Patient is 39 y.o. [id number] [redacted]w[redacted]d postpartum here for evaluation of rash that I believe to be Shingles.  # ?shingles: discussed handwashing at length, discussed with ID and they will see patient in clinic on Monday to follow up.  Patient has already received clindamycin and prednisone for ?contact dermatitis vs cellulitis.  I do believe this is shingles that has been spreading aggressively 2/2 poor hand hygiene.  Started ~2 weeks ago, however will still give acyclovir 800mg  5x daily x 10 days.  Also atarax - wound culture ordered 2/2 unable to order viral culture (unable to order at [facility]), HSV PCR and VZV PCR  #preEclampsia: pt did have preeclampsia, did not receive magnesium intrapartum or postpartum, has only had 1 severe range BP and most recent are only mildly elevated => preEclampsia warnings, keep postpartum follow up.  If any severe range BP may need magnesium.  Discussed with Dr.  [place] Constable, MD 2:56 PM

## 2015-02-26 NOTE — Discharge Instructions (Signed)
Culebrilla (Shingles) La culebrilla est causada por el mismo virus que causa la varicela. El primer signo de esta enfermedad puede ser dolor u hormigueo. Luego de un par [employer] erupcin cutnea. La erupcin cutnea puede aparecer en cualquier parte del cuerpo. Es ms frecuente que el dolor de larga duracin se presente en personas de [place]. Puede persistir desde [employer]. Existen medicamentos que pueden ayudar a Environmental education officer si comienzan a tomarse desde un inicio. CUIDADOS EN EL HOGAR   Tome baos de inmersin fros o colquese paos fros sobre la erupcin como se lo haya indicado el mdico.  Tome los medicamentos solamente como se lo haya indicado el mdico.  Haga reposo tal como le indic el mdico.  Mantenga limpia la zona de la erupcin con jabn suave y agua fra o como se lo haya indicado el mdico.  No se rasque la zona de la erupcin. Puede usar una locin de calamina para IT consultant se lo haya indicado el mdico.  Mantenga la zona de la erupcin cubierta con un vendaje suelto (apsito).  Evite tocar a las siguientes personas:  Bebs.  Embarazadas.  Nios que tienen la piel inflamada (eczema).  Personas que han tenido un trasplante de rgano.  Personas con enfermedades crnicas, como leucemia y sida.  Use ropas sueltas.  Si la erupcin es en el rostro, puede ser necesario que consulte a Teaching laboratory technician. Concurra a todas las visitas de control. Si es posible, los ojos no deben tener contacto con la zona de la culebrilla.  Concurra a todas las visitas de control como se lo haya indicado el mdico. SOLICITE AYUDA DE INMEDIATO SI:   Tiene dolor en el rostro o los ojos.  Pierde la sensacin en un lado del rostro.  Siente dolor o zumbido en el odo.  No siente demasiado los sabores.  Los medicamentos no Financial trader.  La zona de enrojecimiento o hinchazn se extiende.  Siente que est empeorando.  Tiene  fiebre. ASEGRESE DE QUE:   Comprende estas instrucciones.  Controlar su afeccin.  Recibir ayuda de inmediato si no mejora o si empeora. Document Released: 03/09/2009 Document Revised: 04/27/2014 [facility] Patient Information 2015 [place]. This information is not intended to replace advice given to you by your health care provider. Make sure you discuss any questions you have with your health care provider.   Preeclampsia y eclampsia (Preeclampsia and Eclampsia) La preeclampsia es una afeccin grave que solo se manifiesta durante el [place]. Tambin se la conoce como toxemia del Media planner. Esta afeccin provoca el aumento de la presin arterial junto con otros sntomas, como hinchazn y dolores de [place], que pueden aparecer a medida que la preeclampsia empeora. La preeclampsia puede presentarse a las 20semanas de gestacin o posteriormente.  El diagnstico y el tratamiento tempranos de esta afeccin son muy importantes. Si no se la trata de inmediato, puede causarles problemas graves a usted y al beb. Una complicacin que la preeclampsia puede provocar es la eclampsia, una afeccin que causa temblores o contracciones musculares (convulsiones) en la [place]. Provocar el parto del beb es el mejor tratamiento para la preeclampsia o la eclampsia.  FACTORES DE RIESGO La causa de la preeclampsia no se conoce. Puede tener ms probabilidades de desarrollar la afeccin si tiene ciertos factores de Sales executive. Estos incluyen:   Estar embarazada por primera vez.  Haber tenido preeclampsia en un embarazo anterior.  Tener antecedentes familiares de preeclampsia.  Tener presin arterial alta.  Estar embarazada de gemelos o trillizos.  Tener 35aos o ms.  Pertenecer a Advertising copywriter.  Tener enfermedad renal o diabetes.  Tener enfermedades, como lupus o enfermedades de la [place].  Tener mucho sobrepeso (obesidad). [place] primeros signos de preeclampsia  son:  Hipertensin arterial.  Aumento de las protenas en la orina. El Management consultant en cada visita prenatal. Otros sntomas que pueden presentarse incluyen:   Dolor de cabeza intenso  Aumento repentino de peso.  Hinchazn de las manos, el [place] y [employer].  Malestar estomacal (nuseas) y (vmitos).  Problemas visuales (visin doble o borrosa).  Adormecimiento del rostro, los [place], las piernas y los pies.  Mareos.  Hablar arrastrando las palabras.  Sensibilidad a las luces brillantes.  Dolor abdominal. DIAGNSTICO  No hay pruebas diagnsticas para la preeclampsia. El mdico le har preguntas sobre los sntomas y verificar la presencia de signos de preeclampsia durante las visitas prenatales. Tambin pueden hacerle exmenes que incluyen:  Anlisis de [place].  Anlisis de [place].  Control de la frecuencia cardaca del beb.  Control de la salud del beb y de la placenta mediante el uso de imgenes que se generan con ondas sonoras (ecografa). TRATAMIENTO  Puede planificar el mejor abordaje de tratamiento junto con el mdico. Es muy importante que concurra a todas las visitas prenatales. Si tiene un riesgo ms alto de tener preeclampsia, tal vez necesite exmenes prenatales con ms frecuencia.  El mdico tambin puede indicarle que haga reposo en cama.  Tal vez deba comer la menor cantidad de sal posible.  Es posible que tenga que tomar medicamentos para bajar la presin arterial si la afeccin no responde a medidas ms conservadoras.  Quizs deba [employer] hospital si la afeccin es grave. All, el tratamiento estar centrado en el control de la presin arterial y la retencin de lquidos. Puede que tambin tenga que tomar medicamentos para evitar las convulsiones.  Si la afeccin empeora, tal vez sea necesario adelantar el parto para protegerlos a usted y al beb. Pueden provocarle el trabajo de parto con medicamentos (inducirse) o hacerle  una cesrea.  Generalmente, la preeclampsia desaparece despus del parto. INSTRUCCIONES PARA EL CUIDADO EN EL HOGAR   Tome los medicamentos de venta libre o recetados solamente segn las indicaciones del mdico.  Acustese sobre el lado izquierdo mientras hace reposo; de [place], se quita la presin del beb.  Levante los pies mientras hace reposo.  Realice actividad fsica con regularidad. Consulte al mdico qu tipo de ejercicio es seguro para usted.  Evite la cafena y el alcohol.  No fume.  Beba de 6 a 8vasos de Public affairs consultant.  Coma una dieta equilibrada con bajo contenido de sal. No agregue sal a las comidas.  Evite las situaciones estresantes tanto como sea posible.  Descanse y duerma lo suficiente.  Concurra a todas las visitas prenatales y hgase todos los anlisis segn lo programado. SOLICITE ATENCIN MDICA SI:  Aumenta de peso ms de lo esperado.  Tiene dolores de cabeza, dolor abdominal o nuseas.  Aparecen ms hematomas que lo normal.  Se siente mareada o tiene vahdos. SOLICITE ATENCIN MDICA DE INMEDIATO SI:   Aparece una hinchazn repentina o tiene una zona muy hinchada en cualquier parte del cuerpo. Esto suele ocurrir en las piernas.  Tiene un aumento de peso de 5libras (2,3kg) o ms en una semana.  Tiene dolor de cabeza intenso, mareos, problemas visuales o confusin.  Siente un dolor  abdominal intenso.  Tiene nuseas o vmitos permanentes.  Tiene convulsiones.  Tiene dificultad para mover cualquier parte del cuerpo.  Tiene adormecimiento en el cuerpo.  Tiene dificultad para hablar.  Tiene cualquier sangrado anormal.  Siente rigidez en el cuello.  Se desmaya. ASEGRESE DE QUE:   Comprende estas instrucciones.  Controlar su afeccin.  Recibir ayuda de inmediato si no mejora o si empeora. Document Released: 09/20/2005 Document Revised: 12/16/2013 [facility] Patient Information 2015 [place]. This information is not  intended to replace advice given to you by your health care provider. Make sure you discuss any questions you have with your health care provider.

## 2015-02-28 LAB — WOUND CULTURE
Culture: NO GROWTH
GRAM STAIN: NONE SEEN

## 2015-03-02 ENCOUNTER — Encounter (HOSPITAL_COMMUNITY): Payer: Self-pay | Admitting: *Deleted

## 2015-03-02 ENCOUNTER — Telehealth: Payer: Self-pay | Admitting: *Deleted

## 2015-03-02 ENCOUNTER — Emergency Department (HOSPITAL_COMMUNITY)
Admission: EM | Admit: 2015-03-02 | Discharge: 2015-03-02 | Disposition: A | Discharge status: Other Acute Inpatient Hospital | Payer: Self-pay | Attending: Emergency Medicine | Admitting: Emergency Medicine

## 2015-03-02 DIAGNOSIS — B029 Zoster without complications: Secondary | ICD-10-CM

## 2015-03-02 DIAGNOSIS — R21 Rash and other nonspecific skin eruption: Secondary | ICD-10-CM

## 2015-03-02 DIAGNOSIS — L03116 Cellulitis of left lower limb: Secondary | ICD-10-CM | POA: Insufficient documentation

## 2015-03-02 DIAGNOSIS — Z79899 Other long term (current) drug therapy: Secondary | ICD-10-CM | POA: Insufficient documentation

## 2015-03-02 LAB — CBC WITH DIFFERENTIAL/PLATELET
Basophils Absolute: 0 10*3/uL (ref 0.0–0.1)
Basophils Relative: 0 % (ref 0–1)
Eosinophils Absolute: 0.6 10*3/uL (ref 0.0–0.7)
Eosinophils Relative: 6 % — ABNORMAL HIGH (ref 0–5)
HEMATOCRIT: 39.1 % (ref 36.0–46.0)
HEMOGLOBIN: 13.2 g/dL (ref 12.0–15.0)
Lymphocytes Relative: 24 % (ref 12–46)
Lymphs Abs: 2.2 10*3/uL (ref 0.7–4.0)
MCH: 27.2 pg (ref 26.0–34.0)
MCHC: 33.8 g/dL (ref 30.0–36.0)
MCV: 80.6 fL (ref 78.0–100.0)
MONO ABS: 0.5 10*3/uL (ref 0.1–1.0)
MONOS PCT: 5 % (ref 3–12)
NEUTROS ABS: 6 10*3/uL (ref 1.7–7.7)
Neutrophils Relative %: 65 % (ref 43–77)
Platelets: 287 10*3/uL (ref 150–400)
RBC: 4.85 MIL/uL (ref 3.87–5.11)
RDW: 15.5 % (ref 11.5–15.5)
WBC: 9.3 10*3/uL (ref 4.0–10.5)

## 2015-03-02 LAB — COMPREHENSIVE METABOLIC PANEL
ALT: 20 U/L (ref 0–35)
ANION GAP: 7 (ref 5–15)
AST: 23 U/L (ref 0–37)
Albumin: 3.4 g/dL — ABNORMAL LOW (ref 3.5–5.2)
Alkaline Phosphatase: 85 U/L (ref 39–117)
BILIRUBIN TOTAL: 0.5 mg/dL (ref 0.3–1.2)
BUN: 14 mg/dL (ref 6–23)
CALCIUM: 8.4 mg/dL (ref 8.4–10.5)
CHLORIDE: 108 mmol/L (ref 96–112)
CO2: 22 mmol/L (ref 19–32)
CREATININE: 0.71 mg/dL (ref 0.50–1.10)
Glucose, Bld: 104 mg/dL — ABNORMAL HIGH (ref 70–99)
Potassium: 3.8 mmol/L (ref 3.5–5.1)
Sodium: 137 mmol/L (ref 135–145)
Total Protein: 6.5 g/dL (ref 6.0–8.3)

## 2015-03-02 MED ORDER — MORPHINE SULFATE 2 MG/ML IJ SOLN
2.0000 mg | Freq: Once | INTRAMUSCULAR | Status: AC
  Administered 2015-03-02: 2 mg via INTRAVENOUS
  Filled 2015-03-02: qty 1

## 2015-03-02 MED ORDER — VANCOMYCIN HCL IN DEXTROSE 1-5 GM/200ML-% IV SOLN
1000.0000 mg | Freq: Once | INTRAVENOUS | Status: AC
  Administered 2015-03-02: 1000 mg via INTRAVENOUS
  Filled 2015-03-02: qty 200

## 2015-03-02 NOTE — ED Provider Notes (Signed)
CSN: [id number]     Arrival date & time 03/02/15  0038 History  This chart was scribed for Julianne Rice, MD by Rayfield Citizen, ED Scribe. This patient was seen in room B15C/B15C and the patient's care was started at 2:35 AM.    Chief Complaint  Patient presents with  . Rash   Patient is a 39 y.o. female presenting with rash. The history is provided by the patient. No language interpreter was used.  Rash Associated symptoms: no abdominal pain, no fever, no headaches, no nausea, no shortness of breath and not vomiting      HPI Comments: Desiree Marquez is a 39 y.o. female who presents to the Emergency Department complaining of worsening diffuse painful, pruritic rash and chills; husband explains that rash began on the abdomen just after cesarean section on 02/11/15. Per records, patient was seen on 02/26/15 and was diagnosed with shingles; she was started acyclovir and given infectious disease follow-up. She's also had a course of clindamycin and prednisone for presumed cellulitis. She denies fever, nausea, vomiting. Husband denies sick contacts with similar symptoms.   Past Medical History  Diagnosis Date  . Medical history non-contributory   . Headache    Past Surgical History  Procedure Laterality Date  . Appendectomy    . Cholecystectomy    . Cholecystectomy    . Appendectomy    . Cesarean section N/A 02/11/2015    Procedure: CESAREAN SECTION;  Surgeon: Jonnie Kind, MD;  Location: [facility];  Service: Obstetrics;  Laterality: N/A;   Family History  Problem Relation Age of Onset  . Diabetes Mother   . Stroke Father   . Hearing loss Neg Hx    History  Substance Use Topics  . Smoking status: Never Smoker   . Smokeless tobacco: Never Used  . Alcohol Use: No   OB History    Gravida Para Term Preterm AB TAB SAB Ectopic Multiple Living   5 3 3  2  2   0 3     Review of Systems  Constitutional: Positive for chills. Negative for fever.  Eyes: Negative for pain and redness.   Respiratory: Negative for shortness of breath.   Cardiovascular: Negative for chest pain.  Gastrointestinal: Negative for nausea, vomiting and abdominal pain.  Musculoskeletal: Negative for back pain.  Skin: Positive for rash.  Neurological: Negative for dizziness, weakness, light-headedness, numbness and headaches.  All other systems reviewed and are negative.  Allergies  Review of patient's allergies indicates no known allergies.  Home Medications   Prior to Admission medications   Medication Sig Start Date End Date Taking? Authorizing Provider  acyclovir (ZOVIRAX) 400 MG tablet Take 2 tablets (800 mg total) by mouth 5 (five) times daily. 02/26/15 03/08/15 Yes Nila Nephew, MD  hydrOXYzine (VISTARIL) 100 MG capsule Take 1 capsule (100 mg total) by mouth 3 (three) times daily as needed for itching. 02/26/15  Yes Nila Nephew, MD  ibuprofen (ADVIL,MOTRIN) 600 MG tablet Take 1 tablet (600 mg total) by mouth every 6 (six) hours. 02/14/15  Yes Frazier Richards, MD  Prenatal Vit-Fe Fumarate-FA (PRENATAL MULTIVITAMIN) TABS Take 1 tablet by mouth daily at 12 noon.   Yes Historical Provider, MD  hydrOXYzine (ATARAX/VISTARIL) 25 MG tablet Take 1 tablet (25 mg total) by mouth once. Patient not taking: Reported on 03/02/2015 02/26/15   Nila Nephew, MD  oxyCODONE-acetaminophen (PERCOCET/ROXICET) 5-325 MG per tablet Take 2 tablets by mouth every 4 (four) hours as needed (for pain scale equal to or  greater than 7). Patient not taking: Reported on 02/26/2015 02/14/15   Nila Nephew, MD   BP 129/70 mmHg  Pulse 79  Temp(Src) 97.6 F (36.4 C) (Oral)  Resp 20  Ht 4\' 11"  (1.499 m)  Wt 178 lb 6.4 oz (80.922 kg)  BMI 36.01 kg/m2  SpO2 99% Physical Exam  Constitutional: She is oriented to person, place, and time. She appears well-developed and well-nourished.  Patient with active rigors  HENT:  Head: Normocephalic and atraumatic.  Mouth/Throat: Oropharynx is clear and moist.  No obvious ophthalmic or  intraoral involvement/lesions  Eyes: Conjunctivae and EOM are normal. Pupils are equal, round, and reactive to light.  Neck: Normal range of motion. Neck supple.  No meningismus  Cardiovascular: Normal rate and regular rhythm.  Exam reveals no gallop and no friction rub.   No murmur heard. Pulmonary/Chest: Effort normal and breath sounds normal. No respiratory distress. She has no wheezes. She has no rales. She exhibits no tenderness.  Abdominal: Soft. Bowel sounds are normal. She exhibits no distension and no mass. There is no tenderness. There is no rebound and no guarding.  Musculoskeletal: Normal range of motion. She exhibits no edema or tenderness.  Neurological: She is alert and oriented to person, place, and time.  Skin: Skin is warm and dry. Rash noted. There is erythema.  Patient with diffuse truncal and extremity maculopapular erythematous rash. Several areas with vesicles on an erythematous base. No obvious rash to the head or neck. Rash appears most pronounced in the lower abdomen and upper thighs. The left upper thigh the patient has an area of erythema and warmth stemming from an open vesicle. It is tender to palpation. Concerning for cellulitis  Psychiatric: She has a normal mood and affect. Her behavior is normal.  Nursing note and vitals reviewed.   ED Course  Procedures   DIAGNOSTIC STUDIES: Oxygen Saturation is 100% on RA, low by my interpretation.    COORDINATION OF CARE: 2:39 AM Discussed treatment plan with pt at bedside and pt agreed to plan.   Labs Review Labs Reviewed  CBC WITH DIFFERENTIAL/PLATELET - Abnormal; Notable for the following:    Eosinophils Relative 6 (*)    All other components within normal limits  COMPREHENSIVE METABOLIC PANEL - Abnormal; Notable for the following:    Glucose, Bld 104 (*)    Albumin 3.4 (*)    All other components within normal limits  CULTURE, BLOOD (ROUTINE X 2)  CULTURE, BLOOD (ROUTINE X 2)    Imaging Review No  results found.   EKG Interpretation None      MDM   Final diagnoses:  Cellulitis of left lower extremity  Rash    I personally performed the services described in this documentation, which was scribed in my presence. The recorded information has been reviewed and is accurate.   Discussed with Dr. Ernestina Patches who evaluated the patient. Stated he did not feel comfortable caring for the patient especially without inpatient dermatology. Advised transfer to [facility].  Discussed with Dr.Dutta. He'll accept the patient in transport.   Blood cultures drawn and IV antibiotics initiated for left lower extremity cellulitis.    Julianne Rice, MD 03/02/15 [phone]

## 2015-03-02 NOTE — ED Notes (Signed)
Patient presents stating she had a c section 2/18 and shortly afterwards started with a rash all over her body.  Has been seen by MD for the same but nothing has helped.  Unable to sleep, breastfeeding.

## 2015-03-02 NOTE — Telephone Encounter (Signed)
Ambulatory Referral placed to [facility] Disease [facility] for Shingles.  On 03/02/15 pt went to [facility] and was transferred to [facility] for care.

## 2015-03-02 NOTE — ED Notes (Signed)
Phlebotomy at bedside.

## 2015-03-02 NOTE — Consult Note (Signed)
Hospitalist Admission History and Physical  Patient name: Desiree Marquez Medical record number: [id number] Date of birth: 1976-01-13 Age: 39 y.o. Gender: female  Primary Care Provider: No PCP Per Patient  Chief Complaint: rash History of Present Illness:This is a 39 y.o. year old female with significant past medical history of recent delivery of viable infant, pre-eclampsia presenting with rash. Pt/husband report that rash has been present for the last month. Patient noted to have been admitted February 17 of February 20 for induction later in the setting of preeclampsia. There was noted that patient had an abdominal cellulitis to which patient was treated with clindamycin at the time. Patient states his symptoms have progressively worsened over the course of leaving the hospital. Was thought to be dealing with shingles and patient was subsequently placed on trial of acyclovir for treatment. Rash is progressively disseminated across the entire body with subjective chills. Patient denies any new medications apart from ibuprofen. No nausea or vomiting. Rash is been both pruritic and painful. Was scheduled to see infectious disease for symptoms in the next coming 1-2 days. Originally from [place], unaware of immunization history. Denies any known history of illness in the past. Nondiabetic. No history of rash in the past for patient. Presented to the ER afebrile, hemodynamically stable. CBC C May within normal limits.  Assessment and Plan: Desiree Marquez is a 39 y.o. year old female presenting with Rash   Active Problems:   * No active hospital problems. *  1- Rash  -Had a relatively lengthy discussion with ER physician about case. Overall differential for rash is extremely broad including infectious, autoimmune, drug induced etiologies. Given the patient is in the postpartum period as well as there is no formal dermatology coverage here at [facility], I feel patient will be better served at a  tertiary care center where formal dermatology is present. Plan will be for ER physician to transfer patient to [facility] for further evaluation.  Patient Active Problem List   Diagnosis Date Noted  . Pregnancy 02/10/2015  . Hypertension in pregnancy, preeclampsia 12/09/2014   Past Medical History: Past Medical History  Diagnosis Date  . Medical history non-contributory   . Headache     Past Surgical History: Past Surgical History  Procedure Laterality Date  . Appendectomy    . Cholecystectomy    . Cholecystectomy    . Appendectomy    . Cesarean section N/A 02/11/2015    Procedure: CESAREAN SECTION;  Surgeon: Jonnie Kind, MD;  Location: [facility];  Service: Obstetrics;  Laterality: N/A;    Social History: History   Social History  . Marital Status: Single    Spouse Name: N/A  . Number of Children: N/A  . Years of Education: N/A   Social History Main Topics  . Smoking status: Never Smoker   . Smokeless tobacco: Never Used  . Alcohol Use: No  . Drug Use: No  . Sexual Activity: Yes    Birth Control/ Protection: None   Other Topics Concern  . None   Social History Narrative    Family History: Family History  Problem Relation Age of Onset  . Diabetes Mother   . Stroke Father   . Hearing loss Neg Hx     Allergies: No Known Allergies  No current facility-administered medications for this encounter.   Current Outpatient Prescriptions  Medication Sig Dispense Refill  . acyclovir (ZOVIRAX) 400 MG tablet Take 2 tablets (800 mg total) by mouth 5 (five) times daily. 100  tablet 0  . hydrOXYzine (VISTARIL) 100 MG capsule Take 1 capsule (100 mg total) by mouth 3 (three) times daily as needed for itching. 30 capsule 0  . ibuprofen (ADVIL,MOTRIN) 600 MG tablet Take 1 tablet (600 mg total) by mouth every 6 (six) hours. 30 tablet 0  . Prenatal Vit-Fe Fumarate-FA (PRENATAL MULTIVITAMIN) TABS Take 1 tablet by mouth daily at 12 noon.    . hydrOXYzine  (ATARAX/VISTARIL) 25 MG tablet Take 1 tablet (25 mg total) by mouth once. (Patient not taking: Reported on 03/02/2015) 30 tablet 0  . oxyCODONE-acetaminophen (PERCOCET/ROXICET) 5-325 MG per tablet Take 2 tablets by mouth every 4 (four) hours as needed (for pain scale equal to or greater than 7). (Patient not taking: Reported on 02/26/2015) 40 tablet 0   Review Of Systems: 12 point ROS negative except as noted above in HPI.  Physical Exam: Filed Vitals:   03/02/15 0300  BP: 123/72  Pulse: 77  Temp:   Resp: 20    General: cooperative and mild distress HEENT: PERRLA, extra ocular movement intact and mild oral lesion involvement  Heart: S1, S2 normal, no murmur, rub or gallop, regular rate and rhythm Lungs: clear to auscultation, no wheezes or rales and unlabored breathing Abdomen: abdomen is soft without significant tenderness, masses, organomegaly or guarding Extremities: noted diffuse erythematous, scaling rash w/ non specific areas of ecchymoses/blistering Skin: see below-description above      Neurology: normal without focal findings  Labs and Imaging: Lab Results  Component Value Date/Time   NA 137 03/02/2015 02:56 AM   K 3.8 03/02/2015 02:56 AM   CL 108 03/02/2015 02:56 AM   CO2 22 03/02/2015 02:56 AM   BUN 14 03/02/2015 02:56 AM   CREATININE 0.71 03/02/2015 02:56 AM   GLUCOSE 104* 03/02/2015 02:56 AM   Lab Results  Component Value Date   WBC 9.3 03/02/2015   HGB 13.2 03/02/2015   HCT 39.1 03/02/2015   MCV 80.6 03/02/2015   PLT 287 03/02/2015    No results found.         Shanda Howells MD  Pager: [phone]

## 2015-03-08 LAB — CULTURE, BLOOD (ROUTINE X 2)
CULTURE: NO GROWTH
CULTURE: NO GROWTH

## 2015-03-24 ENCOUNTER — Ambulatory Visit: Payer: Self-pay | Admitting: Obstetrics & Gynecology

## 2015-08-16 IMAGING — US US OB TRANSVAGINAL
1 series · 14 of 28 positions shown · non-contrast
Comparison: None.

CLINICAL DATA: Vaginal bleeding. 12 week 5 day gestational age by
LMP.

EXAM:
OBSTETRIC <14 WK US AND TRANSVAGINAL OB US
TECHNIQUE: Both transabdominal and transvaginal ultrasound examinations were
performed for complete evaluation of the gestation as well as the
maternal uterus, adnexal regions, and pelvic cul-de-sac.
Transvaginal technique was performed to assess early pregnancy.

[Series 1: us ob transvaginal · 48 acquisitions, 14 frames shown]
[im 2/48]
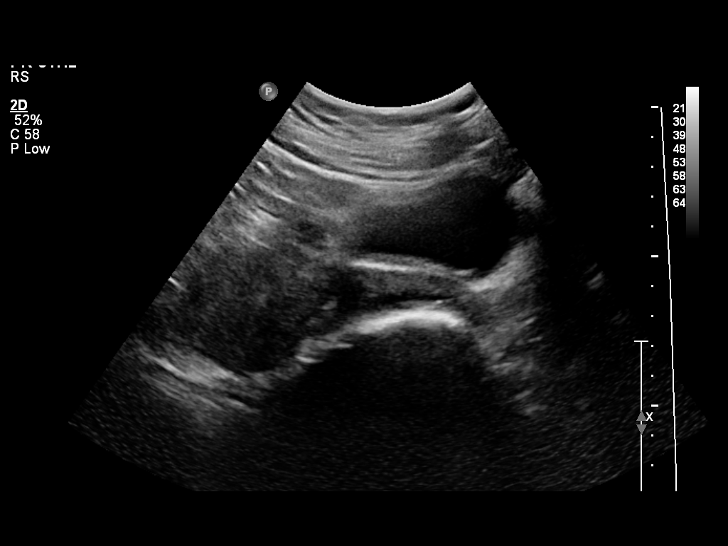
[im 6/48]
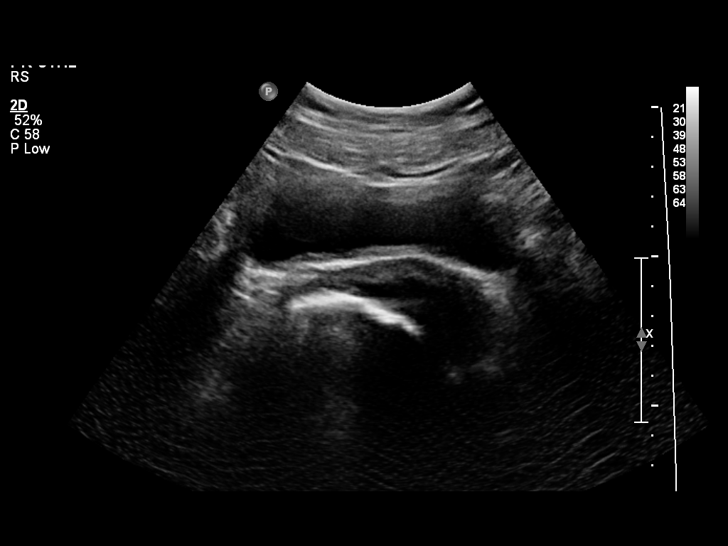
[im 9/48]
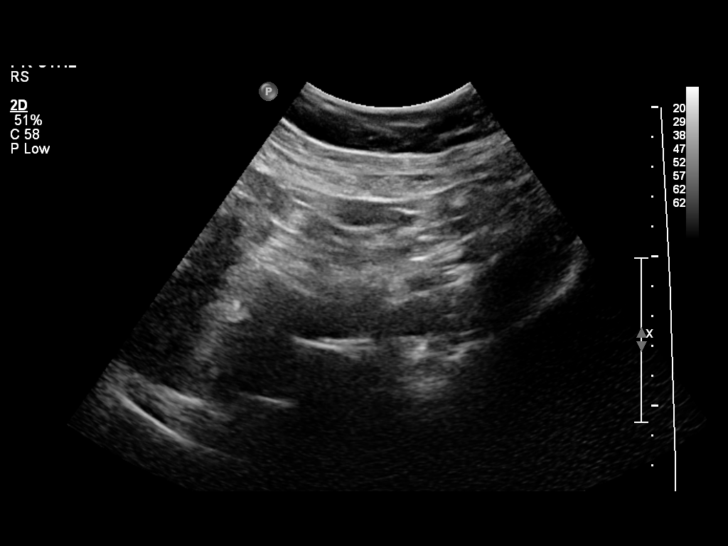
[im 13/48]
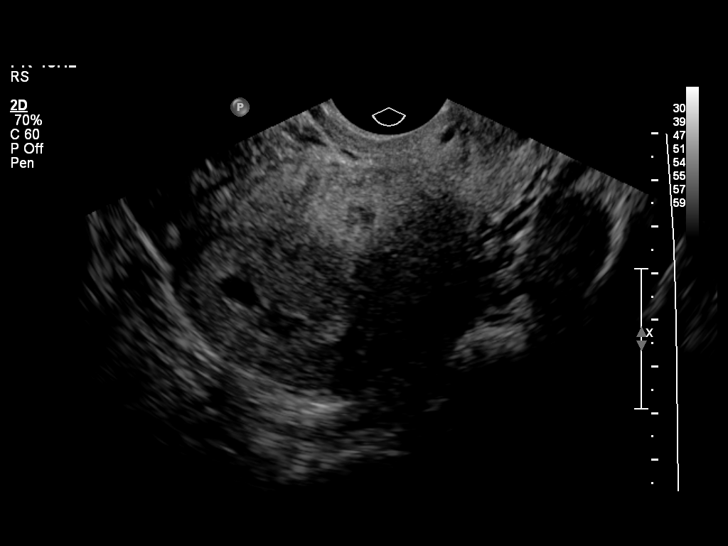
[im 16/48]
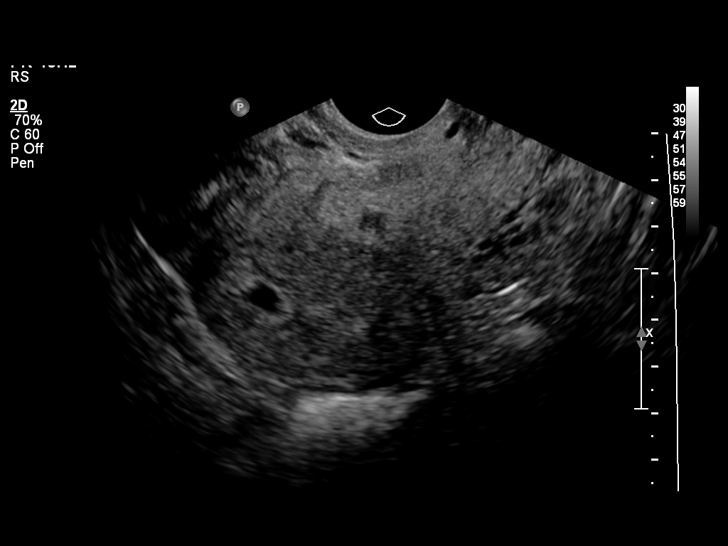
[im 20/48]
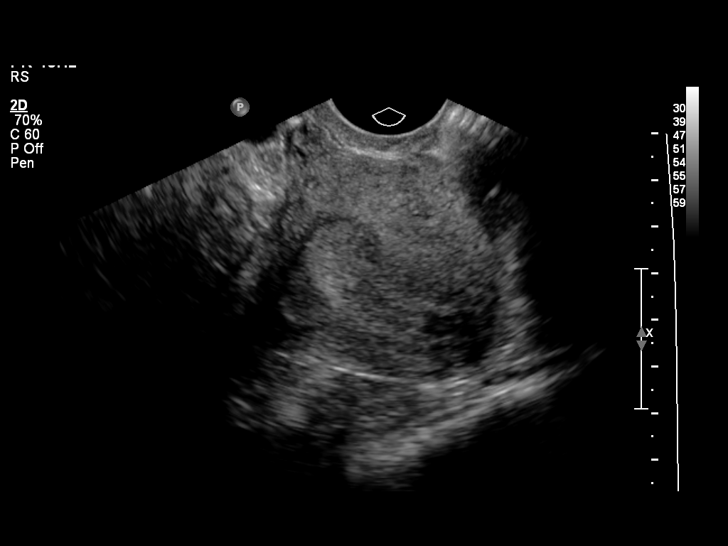
[im 23/48]
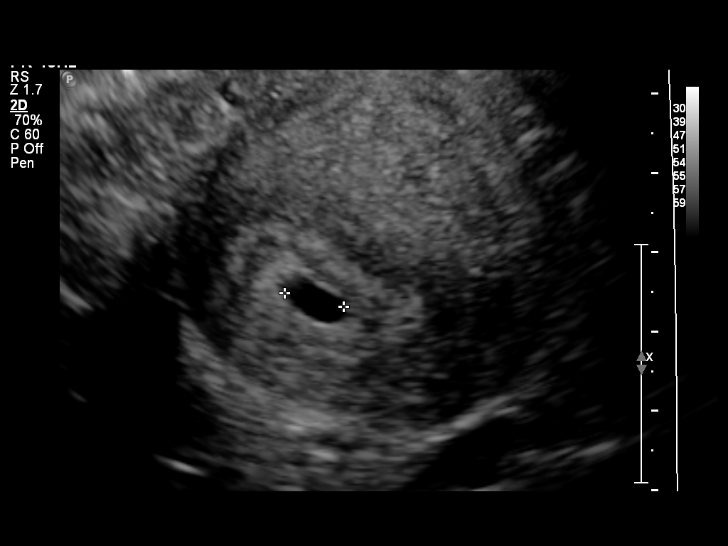
[im 27/48]
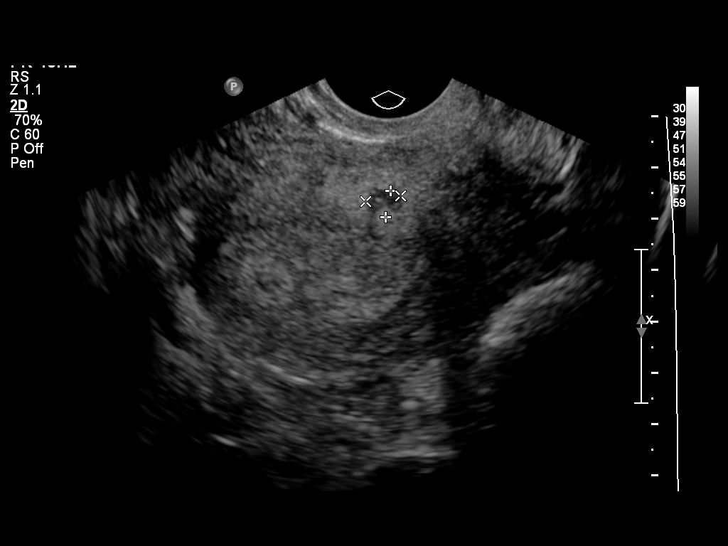
[im 30/48]
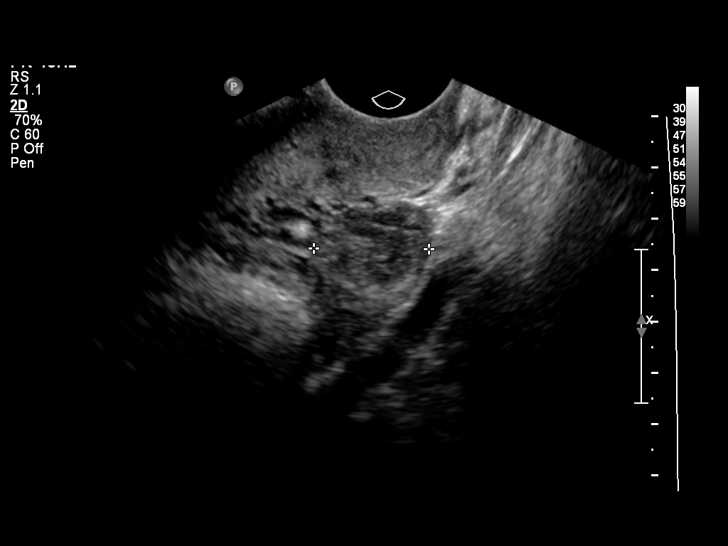
[im 34/48]
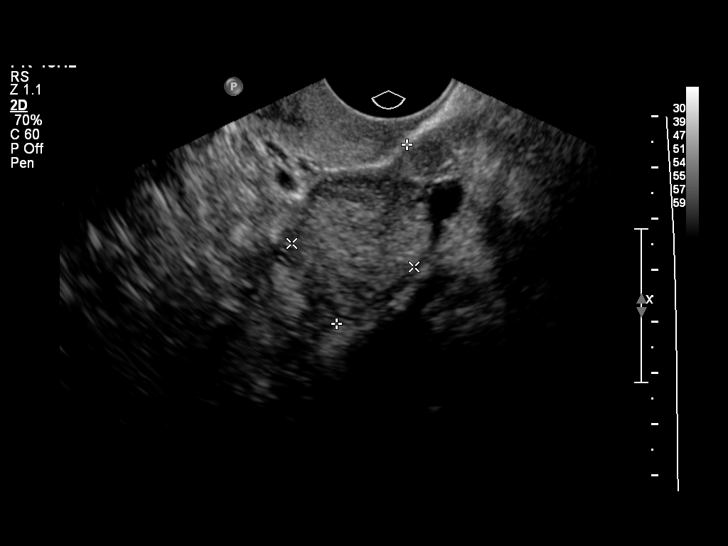
[im 37/48]
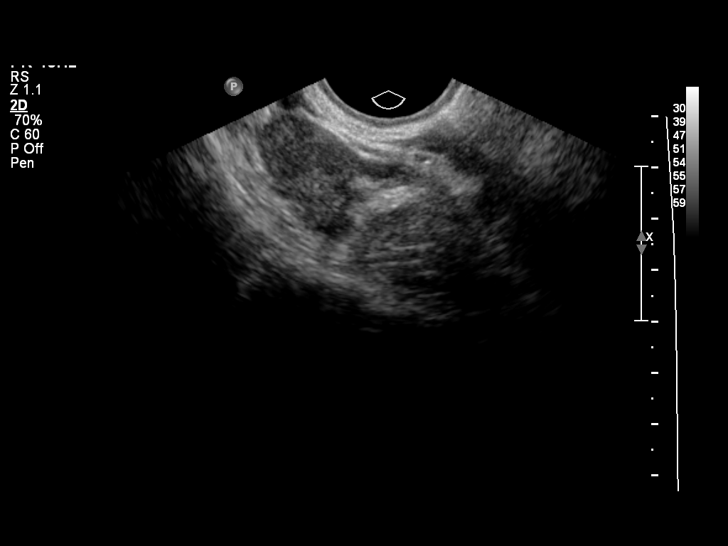
[im 41/48]
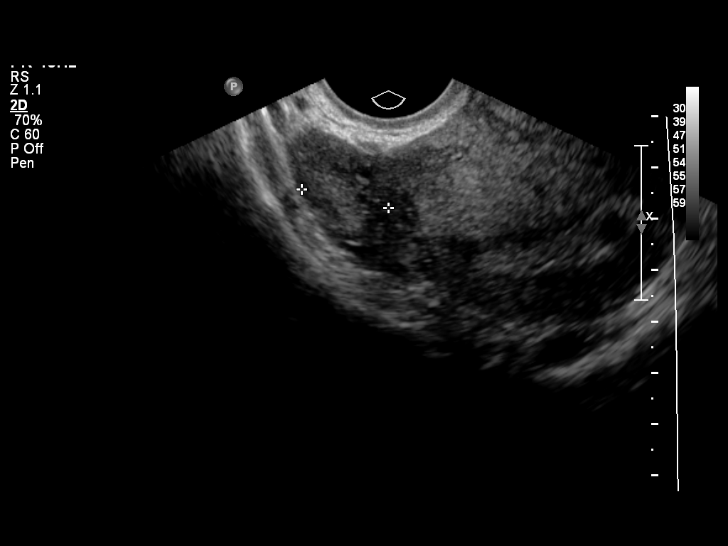
[im 44/48]
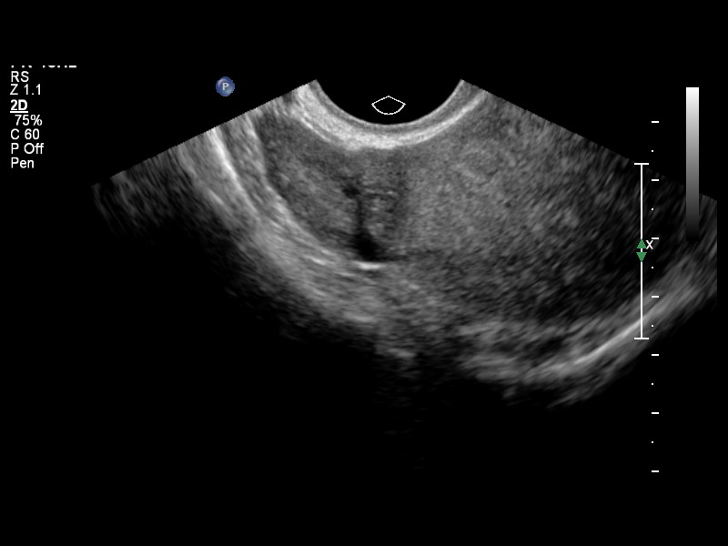
[im 48/48]
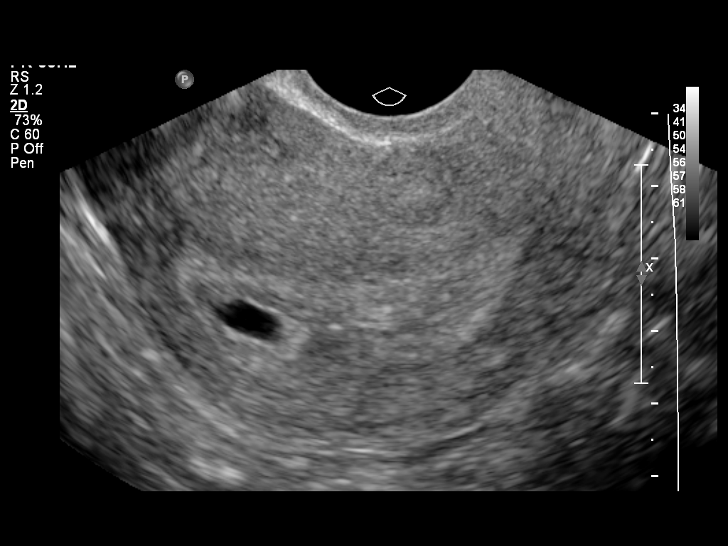

[14 of 28 positions shown; findings below may reference images not displayed]

FINDINGS: Intrauterine gestational sac: Visualized/normal in shape.

Yolk sac:  Not visualized

Embryo:  Not visualized

MSD:  7  mm   5 w   2  d

Maternal uterus/adnexae: Probable tiny less than 1 cm fibroid in the
anterior lower uterine segment. No mass or free fluid identified.
Both ovaries are normal in appearance.
IMPRESSION: Single 5 week intrauterine gestational sac, which is discordant with
LMP. Recommend close followup of quantitative beta HCG levels,
followup ultrasound in 14 days, to assess viability.

## 2015-08-25 IMAGING — US US OB TRANSVAGINAL
1 series · 13 of 28 positions shown · non-contrast
Comparison: 12/29/2013

CLINICAL DATA: Vaginal bleeding, quantitative beta HCG 3,316 today
and measured 2,853 on 12/31/2013

EXAM:
TRANSVAGINAL OB ULTRASOUND
TECHNIQUE: Transvaginal ultrasound was performed for complete evaluation of the
gestation as well as the maternal uterus, adnexal regions, and
pelvic cul-de-sac.

[Series 1: us ob transvaginal · 39 acquisitions, 13 frames shown]
[im 2/39]
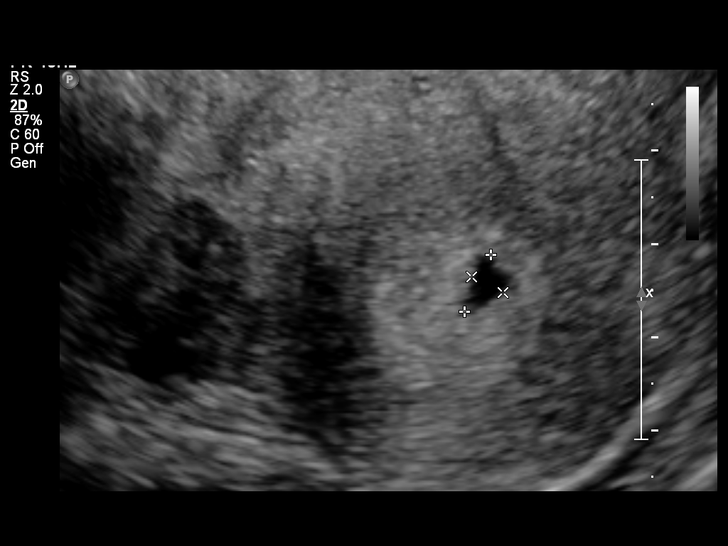
[im 5/39]
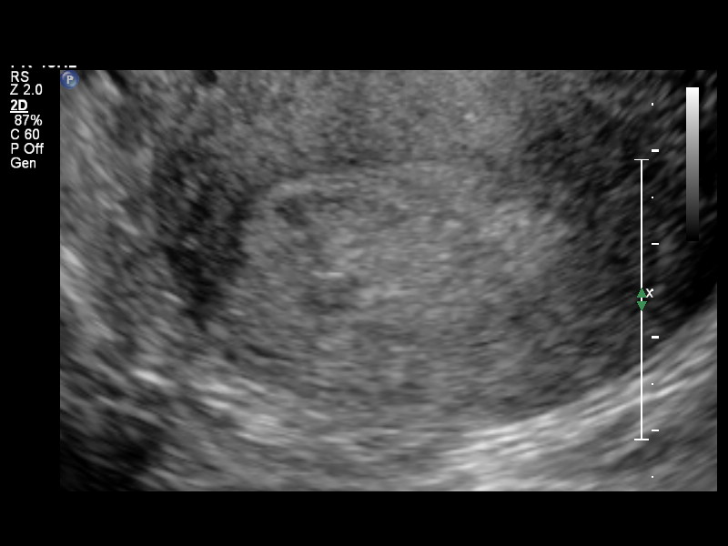
[im 8/39]
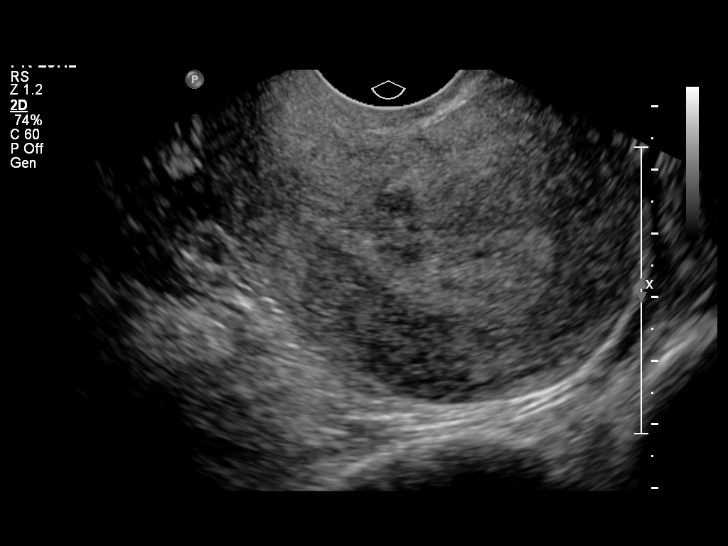
[im 10/39]
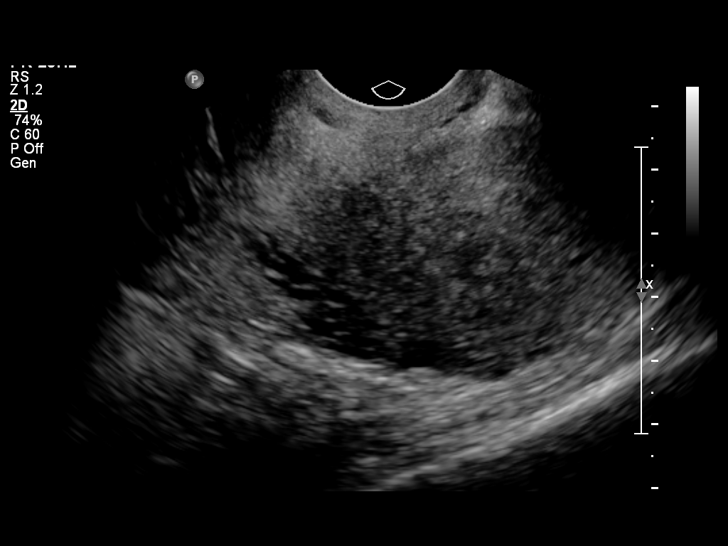
[im 13/39]
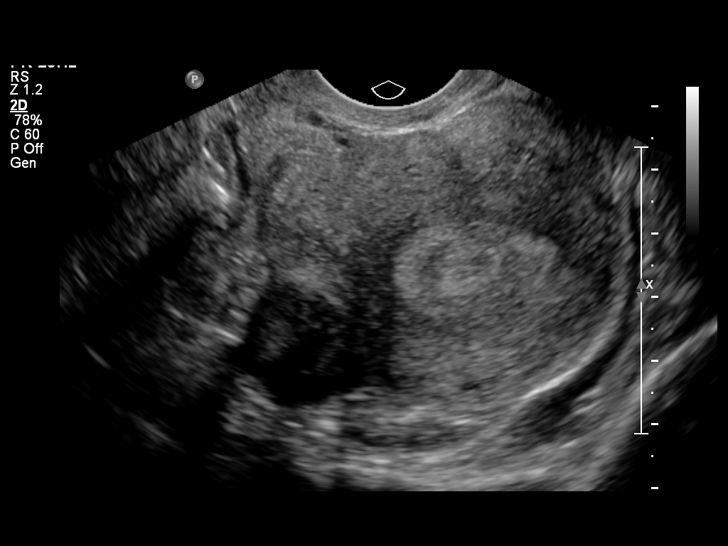
[im 16/39]
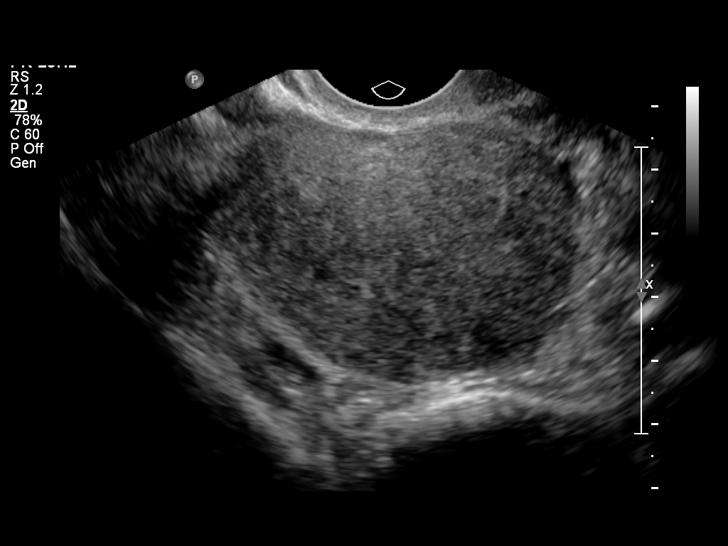
[im 20/39]
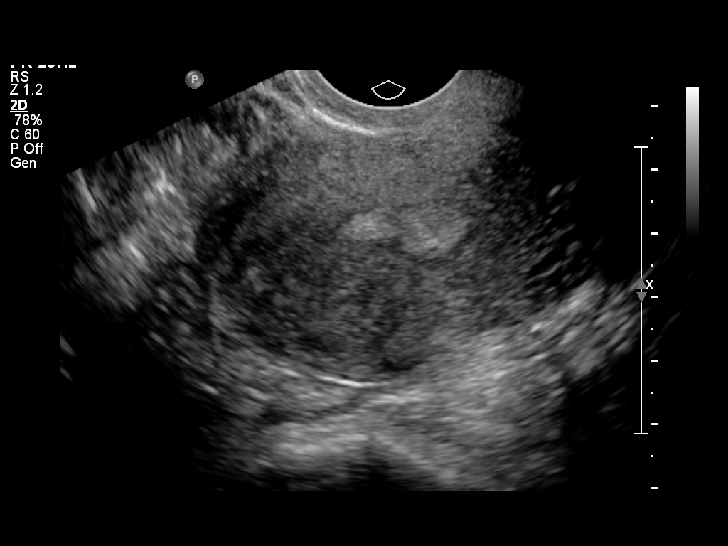
[im 23/39]
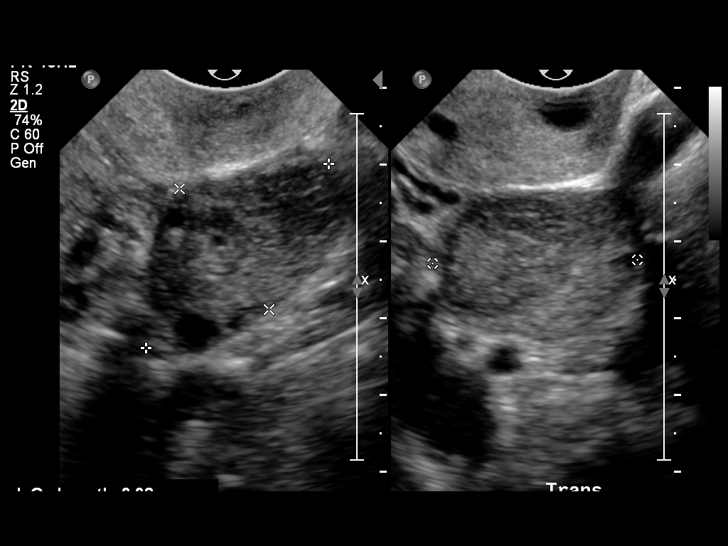
[im 26/39]
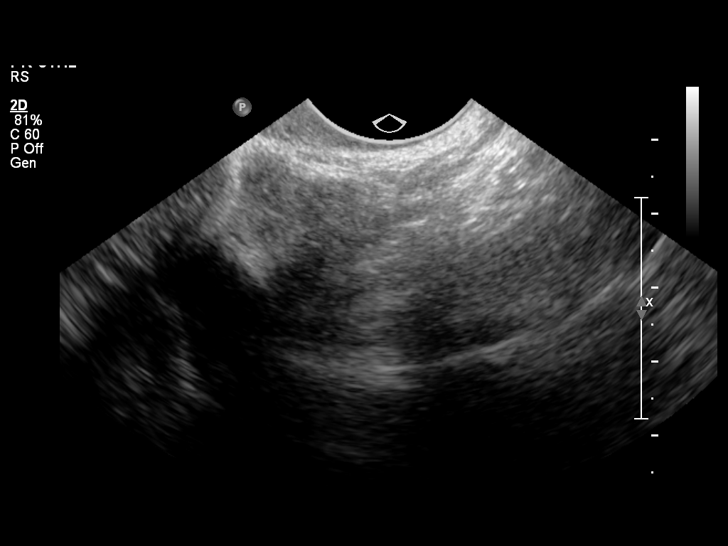
[im 29/39]
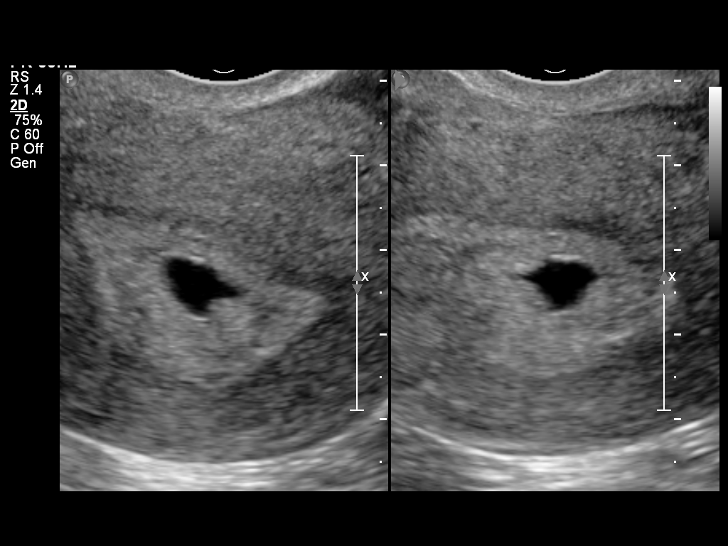
[im 31/39]
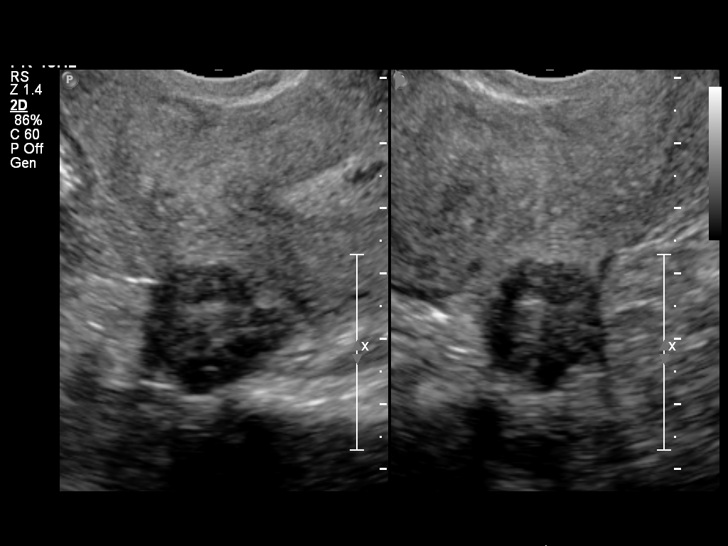
[im 34/39]
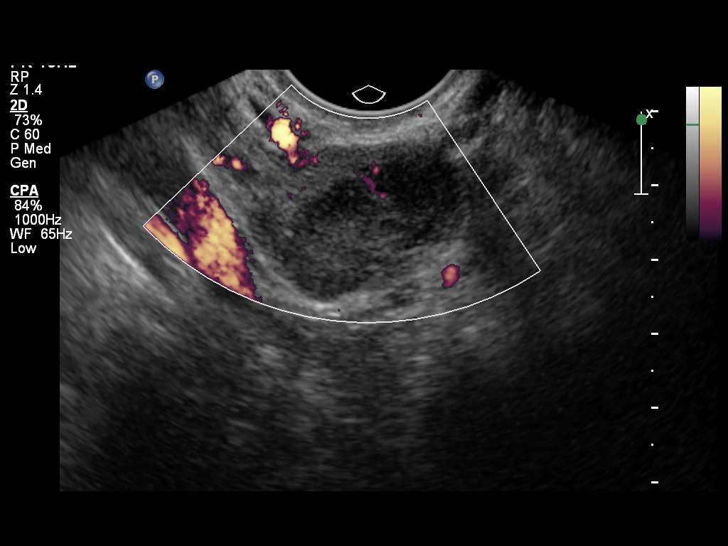
[im 37/39]
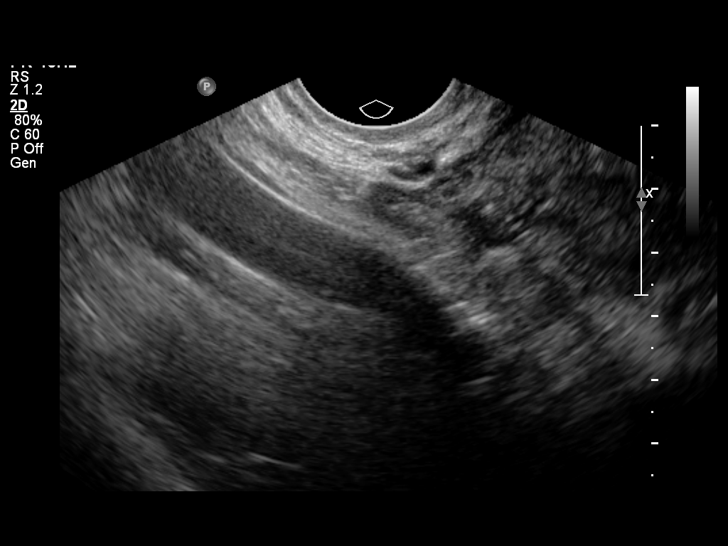

[13 of 28 positions shown; findings below may reference images not displayed]

FINDINGS: Intrauterine gestational sac: Small gestational sac, irregular
shaped

Yolk sac:  Not identified

Embryo:  Not identified

Cardiac Activity: N/A

Heart Rate: N/A bpm

MSD: 6.7  mm   5 w   2  d

Maternal uterus/adnexae:

No definite subchorionic hemorrhage.

Two small uterine leiomyomata, 21 x 19 x 19 mm and 8 x 5 x 7 mm.

Left ovary normal size and morphology, 3.4 x 2.0 x 2.7 cm.

Right ovary normal size and morphology, 2.8 x 2.3 x 2.2 cm.

No adnexal masses or free pelvic fluid.
IMPRESSION: Small irregular gestational sac is identified within the endometrial
canal, demonstrate little increased in size since prior ultrasound
12/29/2013.

No yolk sac or fetal pole identified.

Findings are suspicious but not yet definitive for failed pregnancy.
Recommend follow-up US in 7 days for definitive diagnosis. This
recommendation follows SRU consensus guidelines: Diagnostic Criteria
for Nonviable Pregnancy Early in the First Trimester. N Engl J Med

## 2016-08-15 ENCOUNTER — Encounter (HOSPITAL_COMMUNITY): Payer: Self-pay | Admitting: *Deleted

## 2016-08-15 ENCOUNTER — Inpatient Hospital Stay (HOSPITAL_COMMUNITY): Payer: Self-pay

## 2016-08-15 ENCOUNTER — Inpatient Hospital Stay (HOSPITAL_COMMUNITY)
Admission: AD | Admit: 2016-08-15 | Discharge: 2016-08-16 | Disposition: A | Discharge status: Home / Self Care | Payer: Self-pay | Source: Ambulatory Visit | Attending: Family Medicine | Admitting: Family Medicine

## 2016-08-15 DIAGNOSIS — Z79899 Other long term (current) drug therapy: Secondary | ICD-10-CM | POA: Insufficient documentation

## 2016-08-15 DIAGNOSIS — O200 Threatened abortion: Secondary | ICD-10-CM | POA: Insufficient documentation

## 2016-08-15 DIAGNOSIS — O209 Hemorrhage in early pregnancy, unspecified: Secondary | ICD-10-CM

## 2016-08-15 DIAGNOSIS — Z3A01 Less than 8 weeks gestation of pregnancy: Secondary | ICD-10-CM | POA: Insufficient documentation

## 2016-08-15 LAB — URINE MICROSCOPIC-ADD ON

## 2016-08-15 LAB — WET PREP, GENITAL
Clue Cells Wet Prep HPF POC: NONE SEEN
Sperm: NONE SEEN
Trich, Wet Prep: NONE SEEN
YEAST WET PREP: NONE SEEN

## 2016-08-15 LAB — CBC
HEMATOCRIT: 37.1 % (ref 36.0–46.0)
Hemoglobin: 12.9 g/dL (ref 12.0–15.0)
MCH: 27.5 pg (ref 26.0–34.0)
MCHC: 34.8 g/dL (ref 30.0–36.0)
MCV: 79.1 fL (ref 78.0–100.0)
PLATELETS: 273 10*3/uL (ref 150–400)
RBC: 4.69 MIL/uL (ref 3.87–5.11)
RDW: 12.9 % (ref 11.5–15.5)
WBC: 9.9 10*3/uL (ref 4.0–10.5)

## 2016-08-15 LAB — URINALYSIS, ROUTINE W REFLEX MICROSCOPIC
BILIRUBIN URINE: NEGATIVE
Glucose, UA: NEGATIVE mg/dL
Ketones, ur: NEGATIVE mg/dL
LEUKOCYTES UA: NEGATIVE
NITRITE: NEGATIVE
PROTEIN: NEGATIVE mg/dL
Specific Gravity, Urine: <1.005 — ABNORMAL LOW (ref 1.005–1.030)
pH: 5.5 (ref 5.0–8.0)

## 2016-08-15 LAB — HCG, QUANTITATIVE, PREGNANCY: hCG, Beta Chain, Quant, S: 1873 m[IU]/mL — ABNORMAL HIGH (ref <5)

## 2016-08-15 LAB — POCT PREGNANCY, URINE: Preg Test, Ur: POSITIVE — AB

## 2016-08-15 NOTE — MAU Note (Signed)
PT SAYS STARTED BLEEDING  8-15   - SLIGHT  THROUGH     8-21,  THEN  HEAVY       AT 8 AM    TODAY.    CRAMPING  STARTED   AT 1245PM.         PAD ON IN TRIAGE -     LIGHT  AMT    ON PAD.  ALSO PASSED  CLOTS   TODAY.       WENT TO HD  IN AUG - POSITIVE   UPT.

## 2016-08-15 NOTE — MAU Provider Note (Signed)
History     CSN: [id number]  Arrival date and time: 08/15/16 1901   None     Chief Complaint  Patient presents with  . Vaginal Bleeding   HPI   Ms.Desiree Marquez is a 40 y.o. female [phone] @ [redacted]w[redacted]d here with vaginal bleeding. The bleeding started one week ago, last Tuesday. She had bleeding with her other pregnancies and that is why she did not come earlier. She saw blood clots today and that worried her. They were very small.   She denies pain currently. She had some pain off and on throughout the week.  None currently   OB History    Gravida Para Term Preterm AB Living   6 3 3   2 3    SAB TAB Ectopic Multiple Live Births   2     0 3      Past Medical History:  Diagnosis Date  . Headache   . Medical history non-contributory     Past Surgical History:  Procedure Laterality Date  . APPENDECTOMY    . APPENDECTOMY    . CESAREAN SECTION N/A 02/11/2015   Procedure: CESAREAN SECTION;  Surgeon: Jonnie Kind, MD;  Location: [facility];  Service: Obstetrics;  Laterality: N/A;  . CHOLECYSTECTOMY    . CHOLECYSTECTOMY      Family History  Problem Relation Age of Onset  . Diabetes Mother   . Stroke Father   . Hearing loss Neg Hx     Social History  Substance Use Topics  . Smoking status: Never Smoker  . Smokeless tobacco: Never Used  . Alcohol use No    Allergies: No Known Allergies  Prescriptions Prior to Admission  Medication Sig Dispense Refill Last Dose  . hydrOXYzine (ATARAX/VISTARIL) 25 MG tablet Take 1 tablet (25 mg total) by mouth once. (Patient not taking: Reported on 03/02/2015) 30 tablet 0   . hydrOXYzine (VISTARIL) 100 MG capsule Take 1 capsule (100 mg total) by mouth 3 (three) times daily as needed for itching. 30 capsule 0 03/01/2015 at Unknown time  . ibuprofen (ADVIL,MOTRIN) 600 MG tablet Take 1 tablet (600 mg total) by mouth every 6 (six) hours. 30 tablet 0 03/01/2015 at Unknown time  . oxyCODONE-acetaminophen (PERCOCET/ROXICET) 5-325 MG per tablet  Take 2 tablets by mouth every 4 (four) hours as needed (for pain scale equal to or greater than 7). (Patient not taking: Reported on 02/26/2015) 40 tablet 0   . Prenatal Vit-Fe Fumarate-FA (PRENATAL MULTIVITAMIN) TABS Take 1 tablet by mouth daily at 12 noon.   03/01/2015 at Unknown time   Results for orders placed or performed during the hospital encounter of 08/15/16 (from the past 48 hour(s))  Urinalysis, Routine w reflex microscopic (not at [facility])     Status: Abnormal   Collection Time: 08/15/16  8:08 PM  Result Value Ref Range   Color, Urine YELLOW YELLOW   APPearance CLEAR CLEAR   Specific Gravity, Urine <1.005 (L) 1.005 - 1.030   pH 5.5 5.0 - 8.0   Glucose, UA NEGATIVE NEGATIVE mg/dL   Hgb urine dipstick SMALL (A) NEGATIVE   Bilirubin Urine NEGATIVE NEGATIVE   Ketones, ur NEGATIVE NEGATIVE mg/dL   Protein, ur NEGATIVE NEGATIVE mg/dL   Nitrite NEGATIVE NEGATIVE   Leukocytes, UA NEGATIVE NEGATIVE  Urine microscopic-add on     Status: Abnormal   Collection Time: 08/15/16  8:08 PM  Result Value Ref Range   Squamous Epithelial / LPF 0-5 (A) NONE SEEN   WBC, UA 0-5  0 - 5 WBC/hpf   RBC / HPF 0-5 0 - 5 RBC/hpf   Bacteria, UA RARE (A) NONE SEEN  Pregnancy, urine POC     Status: Abnormal   Collection Time: 08/15/16  8:23 PM  Result Value Ref Range   Preg Test, Ur POSITIVE (A) NEGATIVE    Comment:        THE SENSITIVITY OF THIS METHODOLOGY IS >24 mIU/mL   CBC     Status: None   Collection Time: 08/15/16  9:16 PM  Result Value Ref Range   WBC 9.9 4.0 - 10.5 K/uL   RBC 4.69 3.87 - 5.11 MIL/uL   Hemoglobin 12.9 12.0 - 15.0 g/dL   HCT 37.1 36.0 - 46.0 %   MCV 79.1 78.0 - 100.0 fL   MCH 27.5 26.0 - 34.0 pg   MCHC 34.8 30.0 - 36.0 g/dL   RDW 12.9 11.5 - 15.5 %   Platelets 273 150 - 400 K/uL  hCG, quantitative, pregnancy     Status: Abnormal   Collection Time: 08/15/16  9:16 PM  Result Value Ref Range   hCG, Beta Chain, Quant, S 1,873 (H) <5 mIU/mL    Comment:          GEST. AGE       CONC.  (mIU/mL)   <=1 WEEK        5 - 50     2 WEEKS       50 - 500     3 WEEKS       100 - 10,000     4 WEEKS     1,000 - 30,000     5 WEEKS     3,500 - 115,000   6-8 WEEKS     12,000 - 270,000    12 WEEKS     15,000 - 220,000        FEMALE AND NON-PREGNANT FEMALE:     LESS THAN 5 mIU/mL   Wet prep, genital     Status: Abnormal   Collection Time: 08/15/16 11:06 PM  Result Value Ref Range   Yeast Wet Prep HPF POC NONE SEEN NONE SEEN   Trich, Wet Prep NONE SEEN NONE SEEN   Clue Cells Wet Prep HPF POC NONE SEEN NONE SEEN   WBC, Wet Prep HPF POC FEW (A) NONE SEEN    Comment: MODERATE BACTERIA SEEN   Sperm NONE SEEN    US Ob Comp Less 14 Wks  Result Date: 08/15/2016 CLINICAL DATA:  Vaginal bleeding and first-trimester pregnancy EXAM: OBSTETRIC <14 WK [place] AND TRANSVAGINAL OB US TECHNIQUE: Both transabdominal and transvaginal ultrasound examinations were performed for complete evaluation of the gestation as well as the maternal uterus, adnexal regions, and pelvic cul-de-sac. Transvaginal technique was performed to assess early pregnancy. COMPARISON:  None of this gestation FINDINGS: Intrauterine gestational sac: Single Yolk sac:  Present, 3 mm Embryo:  Not seen, allowable for gestational sac size MSD: 13.1  mm   6 w   1  d Subchorionic hemorrhage:  None visualized. Maternal uterus/adnexae: Negative. IMPRESSION: 1. Intrauterine gestational sac with yolk sac measuring 6 weeks 1 day. An embryo is not visible. 2. No subchorionic hemorrhage to explain bleeding. Electronically Signed   By: Monte Fantasia M.D.   On: 08/15/2016 22:49   US Ob Transvaginal  Result Date: 08/15/2016 CLINICAL DATA:  Vaginal bleeding and first-trimester pregnancy EXAM: OBSTETRIC <14 WK [place] AND TRANSVAGINAL OB US TECHNIQUE: Both transabdominal and transvaginal ultrasound examinations were performed for  complete evaluation of the gestation as well as the maternal uterus, adnexal regions, and pelvic cul-de-sac. Transvaginal  technique was performed to assess early pregnancy. COMPARISON:  None of this gestation FINDINGS: Intrauterine gestational sac: Single Yolk sac:  Present, 3 mm Embryo:  Not seen, allowable for gestational sac size MSD: 13.1  mm   6 w   1  d Subchorionic hemorrhage:  None visualized. Maternal uterus/adnexae: Negative. IMPRESSION: 1. Intrauterine gestational sac with yolk sac measuring 6 weeks 1 day. An embryo is not visible. 2. No subchorionic hemorrhage to explain bleeding. Electronically Signed   By: Monte Fantasia M.D.   On: 08/15/2016 22:49   Review of Systems  Constitutional: Negative for fever.  Gastrointestinal: Positive for nausea and vomiting (2 times today ). Negative for abdominal pain.   Physical Exam   Blood pressure 131/73, pulse 79, temperature 97.7 F (36.5 C), temperature source Oral, resp. rate 18, height 4\' 10"  (1.473 m), weight 187 lb 4 oz (84.9 kg), last menstrual period 06/18/2016, unknown if currently breastfeeding.  Physical Exam  Constitutional: She is oriented to person, place, and time. She appears well-developed and well-nourished. No distress.  HENT:  Head: Normocephalic.  Eyes: Pupils are equal, round, and reactive to light.  Respiratory: Effort normal.  GI: Soft.  Genitourinary:  Genitourinary Comments: Vagina - Small amount of thick, brown vaginal discharge, no odor  Cervix - No contact bleeding, no active bleeding  Bimanual exam: Cervix closed, anterior  Uterus non tender, normal size GC/Chlam, wet prep done Chaperone present for exam.   Musculoskeletal: Normal range of motion.  Neurological: She is alert and oriented to person, place, and time.  Skin: Skin is warm. She is not diaphoretic.  Psychiatric: Her behavior is normal.    MAU Course  Procedures  None  MDM  O positive blood type  HCG  [place]  Assessment and Plan   A:  1. Vaginal bleeding in pregnancy, first trimester   2. Threatened miscarriage     P:  Discharge home in stable  condition Bleeding precautions Return to MAU if symptoms worsen Start prenatal care First trimester warning signs discussed  Lezlie Lye, NP 08/16/2016 1:24 AM

## 2016-08-15 NOTE — Discharge Instructions (Signed)
Hemorragia vaginal durante el embarazo (primer trimestre) (Vaginal Bleeding During Pregnancy, First Trimester) Durante los primeros meses del embarazo es relativamente frecuente que se presente una pequea hemorragia (manchas). Esta situacin generalmente mejora por s misma. Estas hemorragias o manchas tienen diversas causas al inicio del embarazo. Algunas manchas pueden estar relacionadas al [employer] y otras no. En la [employer], la hemorragia es normal y no es un problema. Sin embargo, la hemorragia tambin puede ser un signo de algo grave. Debe informar a su mdico de inmediato si tiene alguna hemorragia vaginal. Algunas causas posibles de hemorragia vaginal durante el primer trimestre incluyen:  Infeccin o inflamacin del cuello del tero.  Crecimientos anormales (plipos) en el cuello del tero.  Aborto espontneo o amenaza de aborto espontneo.  Tejido del Media planner se ha desarrollado fuera del tero y en una trompa de falopio (embarazo ectpico).  Se han desarrollado pequeos quistes en el tero en lugar de tejido de embarazo (embarazo molar). INSTRUCCIONES PARA EL CUIDADO EN EL HOGAR  Controle su afeccin para ver si hay cambios. Las siguientes indicaciones ayudarn a [employer] pueda sentir:  Siga las indicaciones del mdico para restringir su actividad. Si el mdico le indica descanso en la cama, debe quedarse en la cama y levantarse solo para ir al bao. No obstante, el mdico puede permitirle que continu con tareas livianas.  Si es necesario, organcese para que alguien le ayude con las actividades y responsabilidades cotidianas mientras est en cama.  Lleve un registro de la cantidad y la saturacin de las toallas higinicas que Medical laboratory scientific officer. Anote este dato.  No use tampones.No se haga duchas vaginales.  No tenga relaciones sexuales u orgasmos hasta que el mdico la autorice.  Si elimina tejido por la vagina, gurdelo para mostrrselo al  [employer].  [place] solo medicamentos de venta libre o recetados, segn las indicaciones del mdico.  No tome aspirina, ya que puede causar hemorragias.  Cumpla con todas las visitas de control, segn le indique su mdico. SOLICITE ATENCIN MDICA SI:  Tiene un sangrado vaginal en cualquier momento del embarazo.  Tiene calambres o dolores de [place].  Tiene fiebre que los medicamentos no Engineer, petroleum. SOLICITE ATENCIN MDICA DE INMEDIATO SI:   Siente calambres intensos en la espalda o en el vientre (abdomen).  Elimina cogulos grandes o tejido por la vagina.  La hemorragia aumenta.  Si se siente mareada, dbil o se desmaya.  Tiene escalofros.  Tiene una prdida importante o sale lquido a borbotones por la vagina.  Se desmaya mientras defeca. ASEGRESE DE QUE:  Comprende estas instrucciones.  Controlar su afeccin.  Recibir ayuda de inmediato si no mejora o si empeora.   Esta informacin no tiene Marine scientist el consejo del mdico. Asegrese de hacerle al mdico cualquier pregunta que tenga.   Document Released: 09/20/2005 Document Revised: 12/16/2013 Elsevier Interactive Patient Education [employer].

## 2016-08-16 LAB — GC/CHLAMYDIA PROBE AMP (~~LOC~~) NOT AT ARMC
Chlamydia: NEGATIVE
NEISSERIA GONORRHEA: NEGATIVE

## 2016-08-19 ENCOUNTER — Encounter (HOSPITAL_COMMUNITY): Payer: Self-pay | Admitting: *Deleted

## 2016-08-19 ENCOUNTER — Telehealth: Payer: Self-pay | Admitting: Nurse Practitioner

## 2016-08-19 ENCOUNTER — Inpatient Hospital Stay (HOSPITAL_COMMUNITY): Payer: Self-pay

## 2016-08-19 ENCOUNTER — Inpatient Hospital Stay (HOSPITAL_COMMUNITY)
Admission: AD | Admit: 2016-08-19 | Discharge: 2016-08-19 | Disposition: A | Discharge status: Home / Self Care | Payer: Self-pay | Source: Ambulatory Visit | Attending: Obstetrics & Gynecology | Admitting: Obstetrics & Gynecology

## 2016-08-19 DIAGNOSIS — Z880 Allergy status to penicillin: Secondary | ICD-10-CM | POA: Insufficient documentation

## 2016-08-19 DIAGNOSIS — O209 Hemorrhage in early pregnancy, unspecified: Secondary | ICD-10-CM

## 2016-08-19 DIAGNOSIS — R51 Headache: Secondary | ICD-10-CM | POA: Insufficient documentation

## 2016-08-19 DIAGNOSIS — Z823 Family history of stroke: Secondary | ICD-10-CM | POA: Insufficient documentation

## 2016-08-19 DIAGNOSIS — O200 Threatened abortion: Secondary | ICD-10-CM | POA: Insufficient documentation

## 2016-08-19 DIAGNOSIS — O26891 Other specified pregnancy related conditions, first trimester: Secondary | ICD-10-CM | POA: Insufficient documentation

## 2016-08-19 DIAGNOSIS — Z833 Family history of diabetes mellitus: Secondary | ICD-10-CM | POA: Insufficient documentation

## 2016-08-19 DIAGNOSIS — Z9049 Acquired absence of other specified parts of digestive tract: Secondary | ICD-10-CM | POA: Insufficient documentation

## 2016-08-19 DIAGNOSIS — Z3A01 Less than 8 weeks gestation of pregnancy: Secondary | ICD-10-CM | POA: Insufficient documentation

## 2016-08-19 LAB — URINE MICROSCOPIC-ADD ON: SQUAMOUS EPITHELIAL / LPF: NONE SEEN

## 2016-08-19 LAB — CBC
HEMATOCRIT: 37.3 % (ref 36.0–46.0)
HEMOGLOBIN: 13 g/dL (ref 12.0–15.0)
MCH: 27.7 pg (ref 26.0–34.0)
MCHC: 34.9 g/dL (ref 30.0–36.0)
MCV: 79.5 fL (ref 78.0–100.0)
Platelets: 270 10*3/uL (ref 150–400)
RBC: 4.69 MIL/uL (ref 3.87–5.11)
RDW: 12.8 % (ref 11.5–15.5)
WBC: 8.8 10*3/uL (ref 4.0–10.5)

## 2016-08-19 LAB — URINALYSIS, ROUTINE W REFLEX MICROSCOPIC
BILIRUBIN URINE: NEGATIVE
GLUCOSE, UA: NEGATIVE mg/dL
Ketones, ur: NEGATIVE mg/dL
LEUKOCYTES UA: NEGATIVE
Nitrite: NEGATIVE
PROTEIN: NEGATIVE mg/dL
Specific Gravity, Urine: >1.03 — ABNORMAL HIGH (ref 1.005–1.030)
pH: 6 (ref 5.0–8.0)

## 2016-08-19 LAB — HCG, QUANTITATIVE, PREGNANCY: hCG, Beta Chain, Quant, S: 323 m[IU]/mL — ABNORMAL HIGH (ref <5)

## 2016-08-19 NOTE — MAU Note (Signed)
Pt states she was here Tuesday for vaginal bleeding and was told the pregnancy was fine but the bleeding has not stopped.  Pt states she has been bleeding for 12 days.  Pt states if she uses the small pads she has to change them 4-5 times a day but if she uses the big ones she has to change them 2 times per day.

## 2016-08-19 NOTE — MAU Provider Note (Signed)
History     CSN: [id number]  Arrival date and time: 08/19/16 1454   First Provider Initiated Contact with Patient 08/19/16 1518      Chief Complaint  Patient presents with  . Vaginal Bleeding   HPI Desiree Marquez 40 y.o.  [redacted]w[redacted]d  Comes to MAU with continued vaginal bleeding  - now for 14 days.  Is worried about this pregnancy.  Continues to have bleeding when she urinates and sees lots of blood in the toilet.  Is not noticing bleeding except when she is up and going to the bathroom.  Is not having cramping.  Husband encouraged her to return for reevaluation today.  Additionally is having lots of headaches.  Had a headache which started today at 10 am - has not taken any Tylenol today.  OB History    Gravida Para Term Preterm AB Living   6 3 3   2 3    SAB TAB Ectopic Multiple Live Births   2     0 3      Past Medical History:  Diagnosis Date  . Headache   . Medical history non-contributory     Past Surgical History:  Procedure Laterality Date  . APPENDECTOMY    . APPENDECTOMY    . CESAREAN SECTION N/A 02/11/2015   Procedure: CESAREAN SECTION;  Surgeon: Jonnie Kind, MD;  Location: [facility];  Service: Obstetrics;  Laterality: N/A;  . CHOLECYSTECTOMY    . CHOLECYSTECTOMY      Family History  Problem Relation Age of Onset  . Diabetes Mother   . Stroke Father   . Hearing loss Neg Hx     Social History  Substance Use Topics  . Smoking status: Never Smoker  . Smokeless tobacco: Never Used  . Alcohol use No    Allergies:  Allergies  Allergen Reactions  . Penicillins Hives and Other (See Comments)    Has patient had a PCN reaction causing immediate rash, facial/tongue/throat swelling, SOB or lightheadedness with hypotension: No Has patient had a PCN reaction causing severe rash involving mucus membranes or skin necrosis: No Has patient had a PCN reaction that required hospitalization No Has patient had a PCN reaction occurring within the last 10 years: No If  all of the above answers are "NO", then may proceed with Cephalosporin use.    Prescriptions Prior to Admission  Medication Sig Dispense Refill Last Dose  . acetaminophen (TYLENOL) 500 MG tablet Take 1,000 mg by mouth every 6 (six) hours as needed for mild pain, moderate pain or headache.   08/18/2016 at 0800  . Prenatal Vit-Fe Fumarate-FA (PRENATAL MULTIVITAMIN) TABS Take 1 tablet by mouth daily.    08/18/2016 at Unknown time    Review of Systems  Constitutional: Negative for fever.  Gastrointestinal: Negative for abdominal pain, nausea and vomiting.  Genitourinary:       No vaginal discharge. Vaginal bleeding. No dysuria.    Physical Exam   Blood pressure 135/82, pulse 75, temperature 98 F (36.7 C), temperature source Oral, resp. rate 18, last menstrual period 06/18/2016, unknown if currently breastfeeding.  Physical Exam  Nursing note and vitals reviewed. Constitutional: She is oriented to person, place, and time. She appears well-developed and well-nourished.  HENT:  Head: Normocephalic.  Eyes: EOM are normal.  Neck: Neck supple.  GI: Soft. There is no tenderness.  Musculoskeletal: Normal range of motion.  Neurological: She is alert and oriented to person, place, and time.  Skin: Skin is warm and dry.  Psychiatric: She has a normal mood and affect.    MAU Course  Procedures Results for orders placed or performed during the hospital encounter of 08/19/16 (from the past 24 hour(s))  Urinalysis, Routine w reflex microscopic (not at [facility])     Status: Abnormal   Collection Time: 08/19/16  3:01 PM  Result Value Ref Range   Color, Urine YELLOW YELLOW   APPearance CLEAR CLEAR   Specific Gravity, Urine >1.030 (H) 1.005 - 1.030   pH 6.0 5.0 - 8.0   Glucose, UA NEGATIVE NEGATIVE mg/dL   Hgb urine dipstick LARGE (A) NEGATIVE   Bilirubin Urine NEGATIVE NEGATIVE   Ketones, ur NEGATIVE NEGATIVE mg/dL   Protein, ur NEGATIVE NEGATIVE mg/dL   Nitrite NEGATIVE NEGATIVE    Leukocytes, UA NEGATIVE NEGATIVE  Urine microscopic-add on     Status: Abnormal   Collection Time: 08/19/16  3:01 PM  Result Value Ref Range   Squamous Epithelial / LPF NONE SEEN NONE SEEN   WBC, UA 0-5 0 - 5 WBC/hpf   RBC / HPF 6-30 0 - 5 RBC/hpf   Bacteria, UA FEW (A) NONE SEEN  CBC     Status: None   Collection Time: 08/19/16  4:18 PM  Result Value Ref Range   WBC 8.8 4.0 - 10.5 K/uL   RBC 4.69 3.87 - 5.11 MIL/uL   Hemoglobin 13.0 12.0 - 15.0 g/dL   HCT 37.3 36.0 - 46.0 %   MCV 79.5 78.0 - 100.0 fL   MCH 27.7 26.0 - 34.0 pg   MCHC 34.9 30.0 - 36.0 g/dL   RDW 12.8 11.5 - 15.5 %   Platelets 270 150 - 400 K/uL    MDM Reviewed preliminary ultrasound results.  There has not been progression of the pregnancy that is seen on ultrasound in the last four days.  Discussed with client that this may be a miscarriage.  Since no progression on ultrasound, will draw quant today to have on file.  Reviewed with client ways to help her headaches - lots more fluids than she is drinking, protein every time she eats, no skipping meals, and taking Tylenol when she feels the headache start.  Today her BP is in normal range but is in the upper range of normal.  She had problems with her BP in the last pregnancy but has not been on medication since her baby was born.  Assessment and Plan  Vaginal bleeding in first trimester Threatened miscarriage Headache  Plan Follow the instructions already discussed to avoid worsening headaches. [facility]. Return here if you have severe abdominal pain or heavy vaginal bleeding. Bedrest is not necessary.  Pelvic rest is recommended.  Terri L Burleson 08/19/2016, 3:30 PM

## 2016-08-19 NOTE — Telephone Encounter (Signed)
Called with in house interpreter and spoke with client.  Lab results reviewed and due to falling quant, do expect client to have a miscarriage.  Will have [facility] call to set up an appointment in 2 weeks as followup.  Will send message to clinic to arrange appointment. Quant is 323 today down from over 1800.  See also note from MAU visit today.

## 2016-08-19 NOTE — Discharge Instructions (Signed)
No baby is seen on ultrasound today.  Due to the extended bleeding, this pregnancy may end in miscarriage.  It will take more time to know for sure what will happen. [facility] to [facility] care. Return if you have heavy bleeding or severe abdominal pain.

## 2016-08-30 ENCOUNTER — Ambulatory Visit (INDEPENDENT_AMBULATORY_CARE_PROVIDER_SITE_OTHER): Payer: Self-pay | Admitting: Family

## 2016-08-30 ENCOUNTER — Encounter: Payer: Self-pay | Admitting: Family

## 2016-08-30 VITALS — BP 138/78 | HR 69 | Wt 184.2 lb

## 2016-08-30 DIAGNOSIS — O4691 Antepartum hemorrhage, unspecified, first trimester: Secondary | ICD-10-CM

## 2016-08-30 DIAGNOSIS — O209 Hemorrhage in early pregnancy, unspecified: Secondary | ICD-10-CM

## 2016-08-30 NOTE — Progress Notes (Signed)
History   [id number]   Chief Complaint  Patient presents with  . Miscarriage    HPI Desiree Marquez is a 40 y.o. female [phone] here for follow-up after being seen in MAU for vaginal bleeding.  Upon review of the records patient was first seen on 08/15/16 for vaginal bleeding.   BHCG on that day was 1873.  Ultrasound showed an ntrauterine gestational sac with yolk sac measuring 6 weeks 1 day. An embryo is not visible.Marland Kitchen  GC/CT and wet prep were collected.  Results were negative.   Pt discharged home.  Pt returns on 08/19/16 with continued bleeding.  BHG was found to be 323.   Ultrasound no longer showed a yolk sac. Pt here today with no report of abdominal pain or vaginal bleeding.   All other systems negative.    Patient's last menstrual period was 06/18/2016.  OB History  Gravida Para Term Preterm AB Living  6 3 3   2 3   SAB TAB Ectopic Multiple Live Births  2     0 3    # Outcome Date GA Lbr Len/2nd Weight Sex Delivery Anes PTL Lv  6 Current           5 Term 02/11/15 [redacted]w[redacted]d  4 lb 12.2 oz (2.16 kg) M CS-LTranv Spinal  LIV  4 SAB           3 SAB           2 Term      Vag-Spont   LIV  1 Term      Vag-Spont   LIV      Past Medical History:  Diagnosis Date  . Headache   . Medical history non-contributory     Family History  Problem Relation Age of Onset  . Diabetes Mother   . Stroke Father   . Hearing loss Neg Hx     Social History   Social History  . Marital status: Single    Spouse name: N/A  . Number of children: N/A  . Years of education: N/A   Social History Main Topics  . Smoking status: Never Smoker  . Smokeless tobacco: Never Used  . Alcohol use No  . Drug use: No  . Sexual activity: Yes    Birth control/ protection: None   Other Topics Concern  . None   Social History Narrative  . None    Allergies  Allergen Reactions  . Penicillins Hives and Other (See Comments)    Has patient had a PCN reaction causing immediate rash, facial/tongue/throat  swelling, SOB or lightheadedness with hypotension: No Has patient had a PCN reaction causing severe rash involving mucus membranes or skin necrosis: No Has patient had a PCN reaction that required hospitalization No Has patient had a PCN reaction occurring within the last 10 years: No If all of the above answers are "NO", then may proceed with Cephalosporin use.    Current Outpatient Prescriptions on File Prior to Visit  Medication Sig Dispense Refill  . Prenatal Vit-Fe Fumarate-FA (PRENATAL MULTIVITAMIN) TABS Take 1 tablet by mouth daily.     Marland Kitchen acetaminophen (TYLENOL) 500 MG tablet Take 1,000 mg by mouth every 6 (six) hours as needed for mild pain, moderate pain or headache.     No current facility-administered medications on file prior to visit.      Physical Exam   Vitals:   08/30/16 1001  BP: 138/78  Pulse: 69  Weight: 184 lb 3.2 oz (83.6 kg)  Physical Exam  Constitutional: She is oriented to person, place, and time. She appears well-developed and well-nourished. No distress.  HENT:  Head: Normocephalic.  Neck: Neck supple.  Respiratory: Effort normal and breath sounds normal.  Neurological: She is alert and oriented to person, place, and time. She has normal reflexes.  Skin: Skin is warm and dry.  Psychiatric: She has a normal mood and affect.    MAU Course  Procedures  Assessment and Plan  Miscarriage  Plan: Repeat BHCG Discharge with bleeding precautions  Gwen Pounds, CNM 08/30/2016 10:06 AM

## 2016-08-30 NOTE — Progress Notes (Signed)
Video Interpreter # [id number]

## 2016-08-31 LAB — HCG, QUANTITATIVE, PREGNANCY: hCG, Beta Chain, Quant, S: 9.2 m[IU]/mL — ABNORMAL HIGH

## 2017-06-20 ENCOUNTER — Encounter (HOSPITAL_COMMUNITY): Payer: Self-pay

## 2020-09-29 ENCOUNTER — Other Ambulatory Visit: Payer: Self-pay

## 2020-09-29 ENCOUNTER — Emergency Department (HOSPITAL_COMMUNITY)
Admission: EM | Admit: 2020-09-29 | Discharge: 2020-09-29 | Disposition: A | Discharge status: Home / Self Care | Payer: Self-pay | Attending: Emergency Medicine | Admitting: Emergency Medicine

## 2020-09-29 ENCOUNTER — Encounter (HOSPITAL_COMMUNITY): Payer: Self-pay

## 2020-09-29 ENCOUNTER — Emergency Department (HOSPITAL_COMMUNITY): Payer: Self-pay

## 2020-09-29 DIAGNOSIS — R200 Anesthesia of skin: Secondary | ICD-10-CM | POA: Insufficient documentation

## 2020-09-29 DIAGNOSIS — G510 Bell's palsy: Secondary | ICD-10-CM | POA: Insufficient documentation

## 2020-09-29 LAB — CBG MONITORING, ED: Glucose-Capillary: 104 mg/dL — ABNORMAL HIGH (ref 70–99)

## 2020-09-29 MED ORDER — PREDNISONE 10 MG (21) PO TBPK: ORAL_TABLET | Freq: Every day | ORAL | 0 refills | Status: AC

## 2020-09-29 MED ORDER — VALACYCLOVIR HCL 1 G PO TABS: 1000.0000 mg | ORAL_TABLET | Freq: Three times a day (TID) | ORAL | 0 refills | Status: AC

## 2020-09-29 MED ORDER — PREDNISONE 10 MG (21) PO TBPK: ORAL_TABLET | Freq: Every day | ORAL | 0 refills | Status: DC

## 2020-09-29 MED ORDER — VALACYCLOVIR HCL 1 G PO TABS: 1000.0000 mg | ORAL_TABLET | Freq: Three times a day (TID) | ORAL | 0 refills | Status: DC

## 2020-09-29 MED ORDER — OXYCODONE-ACETAMINOPHEN 5-325 MG PO TABS
1.0000 | ORAL_TABLET | Freq: Once | ORAL | Status: AC
  Administered 2020-09-29: 1 via ORAL
  Filled 2020-09-29: qty 1

## 2020-09-29 MED ORDER — HYDROCODONE-ACETAMINOPHEN 5-325 MG PO TABS: 2.0000 | ORAL_TABLET | ORAL | 0 refills | Status: AC | PRN

## 2020-09-29 NOTE — ED Provider Notes (Signed)
[place] [facility] DEPT Provider Note   CSN: [id number] Arrival date & time: 09/29/20  1017     History Chief Complaint  Patient presents with  . Facial Droop    Desiree Marquez is a 44 y.o. female.  44 year old female presents with left-sided facial droop which she noticed this morning at 430.  Last seen normal was 11:00 last night.  Need patient states that for a week she has had headache, left ear pain.  She has had trouble closing her eye on the left.  Chronic denies any ataxia.  Denies any focal weakness.  Now she says she has numbness to the left side of her face.  Had similar symptoms she says when she was pregnant.  Denies any use of blood thinners.  No treatment use prior to arrival.  Patient is Spanish-speaking and interpreter was used        Past Medical History:  Diagnosis Date  . Headache   . Medical history non-contributory     Patient Active Problem List   Diagnosis Date Noted  . Pregnancy 02/10/2015  . Hypertension in pregnancy, preeclampsia 12/09/2014    Past Surgical History:  Procedure Laterality Date  . APPENDECTOMY    . APPENDECTOMY    . CESAREAN SECTION N/A 02/11/2015   Procedure: CESAREAN SECTION;  Surgeon: Jonnie Kind, MD;  Location: [facility];  Service: Obstetrics;  Laterality: N/A;  . CHOLECYSTECTOMY    . CHOLECYSTECTOMY       OB History    Gravida  6   Para  3   Term  3   Preterm      AB  2   Living  3     SAB  2   TAB      Ectopic      Multiple  0   Live Births  3           Family History  Problem Relation Age of Onset  . Diabetes Mother   . Stroke Father   . Hearing loss Neg Hx     Social History   Tobacco Use  . Smoking status: Never Smoker  . Smokeless tobacco: Never Used  Substance Use Topics  . Alcohol use: No  . Drug use: No    Home Medications Prior to Admission medications   Medication Sig Start Date End Date Taking? Authorizing Provider  acetaminophen  (TYLENOL) 500 MG tablet Take 1,000 mg by mouth every 6 (six) hours as needed for mild pain, moderate pain or headache.    [provider]  Prenatal Vit-Fe Fumarate-FA (PRENATAL MULTIVITAMIN) TABS Take 1 tablet by mouth daily.     [provider]    Allergies    Penicillins  Review of Systems   Review of Systems  All other systems reviewed and are negative.   Physical Exam Updated Vital Signs BP (!) 165/105 (BP Location: Left Arm)   Pulse 71   Temp 98.8 F (37.1 C) (Oral)   Resp 16   SpO2 99%   Physical Exam Vitals and nursing note reviewed.  Constitutional:      General: She is not in acute distress.    Appearance: Normal appearance. She is well-developed. She is not toxic-appearing.  HENT:     Head: Normocephalic and atraumatic.  Eyes:     General: Lids are normal.     Conjunctiva/sclera: Conjunctivae normal.     Pupils: Pupils are equal, round, and reactive to light.  Neck:  Thyroid: No thyroid mass.     Trachea: No tracheal deviation.  Cardiovascular:     Rate and Rhythm: Normal rate and regular rhythm.     Heart sounds: Normal heart sounds. No murmur heard.  No gallop.   Pulmonary:     Effort: Pulmonary effort is normal. No respiratory distress.     Breath sounds: Normal breath sounds. No stridor. No decreased breath sounds, wheezing, rhonchi or rales.  Abdominal:     General: Bowel sounds are normal. There is no distension.     Palpations: Abdomen is soft.     Tenderness: There is no abdominal tenderness. There is no rebound.  Musculoskeletal:        General: No tenderness. Normal range of motion.     Cervical back: Normal range of motion and neck supple.  Skin:    General: Skin is warm and dry.     Findings: No abrasion or rash.  Neurological:     Mental Status: She is alert and oriented to person, place, and time.     GCS: GCS eye subscore is 4. GCS verbal subscore is 5. GCS motor subscore is 6.     Cranial Nerves: Cranial nerve  deficit and facial asymmetry present. No dysarthria.     Sensory: No sensory deficit.     Motor: Motor function is intact.     Coordination: Coordination is intact.     Comments: Left-sided facial droop appreciated.  Patient unable to wrinkle her forehead  Psychiatric:        Speech: Speech normal.        Behavior: Behavior normal.     ED Results / Procedures / Treatments   Labs (all labs ordered are listed, but only abnormal results are displayed) Labs Reviewed  CBG MONITORING, ED - Abnormal; Notable for the following components:      Result Value   Glucose-Capillary 104 (*)    All other components within normal limits    EKG None  Radiology No results found.  Procedures Procedures (including critical care time)  Medications Ordered in ED Medications - No data to display  ED Course  I have reviewed the triage vital signs and the nursing notes.  Pertinent labs & imaging results that were available during my care of the patient were reviewed by me and considered in my medical decision making (see chart for details).    MDM Rules/Calculators/A&P                          Head CT without acute findings.  Patient clinically has Bell's palsy.  Will place on steroids and acyclovir.  Will inform patient to patch her eye for comfort Final Clinical Impression(s) / ED Diagnoses Final diagnoses:  None    Rx / DC Orders ED Discharge Orders    None       Lacretia Leigh, MD 09/29/20 1253

## 2020-09-29 NOTE — ED Triage Notes (Signed)
Pt c/o headache, and left sided arm pain x1 day. Facial droop and difficultly speak since 430 am, pt states difficultly speaking has subsided. Unable to feel sensation on left side of face, able to feel tactile stim bilaterally upper and lower extremities. Grip strengths equal.

## 2020-09-29 NOTE — ED Notes (Signed)
Husband at bedside.  

## 2020-09-29 NOTE — ED Notes (Addendum)
Pt reports that since Wednesday last week- pain to left side of neck.  Starting at 0430 pt noted left side facial droop.   [id number] 09/28/20

## 2020-10-01 ENCOUNTER — Telehealth: Payer: Self-pay | Admitting: *Deleted

## 2020-10-01 NOTE — Telephone Encounter (Signed)
Pharmacy called related to Rx: Vicodin being over recommended dosage .Marland KitchenMarland KitchenEDCM clarified with EDP  to change Rx directions to: 1 tablet by mouth every 4 hours as needed for pain.

## 2021-05-25 ENCOUNTER — Other Ambulatory Visit: Payer: Self-pay | Admitting: Obstetrics and Gynecology

## 2021-05-25 DIAGNOSIS — Z1231 Encounter for screening mammogram for malignant neoplasm of breast: Secondary | ICD-10-CM

## 2021-06-30 ENCOUNTER — Ambulatory Visit: Payer: Self-pay
